# Patient Record
Sex: Male | Born: 1950 | Race: Black or African American | Hispanic: No | Marital: Married | Smoking: Never smoker
Health system: Southern US, Community
[De-identification: ages and names within clinical notes are randomized; demographics above are authoritative.]

## PROBLEM LIST (undated history)

## (undated) DIAGNOSIS — I10 Essential (primary) hypertension: Secondary | ICD-10-CM

## (undated) DIAGNOSIS — L299 Pruritus, unspecified: Secondary | ICD-10-CM

## (undated) DIAGNOSIS — R0600 Dyspnea, unspecified: Secondary | ICD-10-CM

## (undated) DIAGNOSIS — E119 Type 2 diabetes mellitus without complications: Secondary | ICD-10-CM

## (undated) DIAGNOSIS — K759 Inflammatory liver disease, unspecified: Secondary | ICD-10-CM

## (undated) DIAGNOSIS — F528 Other sexual dysfunction not due to a substance or known physiological condition: Secondary | ICD-10-CM

## (undated) DIAGNOSIS — R079 Chest pain, unspecified: Secondary | ICD-10-CM

## (undated) DIAGNOSIS — K219 Gastro-esophageal reflux disease without esophagitis: Secondary | ICD-10-CM

## (undated) DIAGNOSIS — J029 Acute pharyngitis, unspecified: Secondary | ICD-10-CM

## (undated) DIAGNOSIS — H547 Unspecified visual loss: Secondary | ICD-10-CM

## (undated) DIAGNOSIS — E785 Hyperlipidemia, unspecified: Secondary | ICD-10-CM

## (undated) HISTORY — DX: Acute pharyngitis, unspecified: J02.9

## (undated) HISTORY — DX: Chest pain, unspecified: R07.9

## (undated) HISTORY — DX: Inflammatory liver disease, unspecified: K75.9

## (undated) HISTORY — DX: Gastro-esophageal reflux disease without esophagitis: K21.9

## (undated) HISTORY — PX: HIATAL HERNIA REPAIR: SHX195

## (undated) HISTORY — PX: LIVER BIOPSY: SHX301

## (undated) HISTORY — PX: CERVICAL SPINE SURGERY: SHX589

## (undated) HISTORY — DX: Essential (primary) hypertension: I10

## (undated) HISTORY — DX: Unspecified visual loss: H54.7

## (undated) HISTORY — DX: Type 2 diabetes mellitus without complications: E11.9

## (undated) HISTORY — PX: EYE SURGERY: SHX253

## (undated) HISTORY — PX: CHOLECYSTECTOMY: SHX55

## (undated) HISTORY — DX: Other sexual dysfunction not due to a substance or known physiological condition: F52.8

## (undated) HISTORY — PX: CORNEAL TRANSPLANT: SHX108

## (undated) HISTORY — DX: Pruritus, unspecified: L29.9

---

## 2003-08-20 DIAGNOSIS — H547 Unspecified visual loss: Secondary | ICD-10-CM

## 2003-08-20 HISTORY — DX: Unspecified visual loss: H54.7

## 2005-03-08 ENCOUNTER — Ambulatory Visit: Payer: Self-pay | Admitting: Cardiology

## 2005-05-14 ENCOUNTER — Ambulatory Visit (HOSPITAL_COMMUNITY): Admission: RE | Admit: 2005-05-14 | Discharge: 2005-05-14 | Payer: Self-pay | Admitting: General Surgery

## 2006-10-16 ENCOUNTER — Ambulatory Visit: Payer: Self-pay | Admitting: Cardiology

## 2007-06-25 ENCOUNTER — Ambulatory Visit: Payer: Self-pay | Admitting: Cardiology

## 2007-07-13 ENCOUNTER — Ambulatory Visit: Payer: Self-pay | Admitting: Cardiology

## 2007-07-15 ENCOUNTER — Inpatient Hospital Stay (HOSPITAL_BASED_OUTPATIENT_CLINIC_OR_DEPARTMENT_OTHER): Admission: RE | Admit: 2007-07-15 | Discharge: 2007-07-15 | Payer: Self-pay | Admitting: Internal Medicine

## 2007-07-15 ENCOUNTER — Ambulatory Visit: Payer: Self-pay | Admitting: Internal Medicine

## 2007-08-27 ENCOUNTER — Emergency Department (HOSPITAL_COMMUNITY): Admission: EM | Admit: 2007-08-27 | Discharge: 2007-08-27 | Payer: Self-pay | Admitting: Emergency Medicine

## 2007-09-03 ENCOUNTER — Emergency Department (HOSPITAL_COMMUNITY): Admission: EM | Admit: 2007-09-03 | Discharge: 2007-09-03 | Payer: Self-pay | Admitting: Emergency Medicine

## 2007-09-07 ENCOUNTER — Emergency Department (HOSPITAL_COMMUNITY): Admission: EM | Admit: 2007-09-07 | Discharge: 2007-09-07 | Payer: Self-pay | Admitting: Emergency Medicine

## 2007-10-23 ENCOUNTER — Emergency Department (HOSPITAL_COMMUNITY): Admission: EM | Admit: 2007-10-23 | Discharge: 2007-10-23 | Payer: Self-pay | Admitting: Emergency Medicine

## 2007-11-25 ENCOUNTER — Ambulatory Visit: Payer: Self-pay | Admitting: Cardiology

## 2009-08-11 ENCOUNTER — Emergency Department (HOSPITAL_COMMUNITY): Admission: EM | Admit: 2009-08-11 | Discharge: 2009-08-12 | Payer: Self-pay | Admitting: Emergency Medicine

## 2010-10-25 DIAGNOSIS — R079 Chest pain, unspecified: Secondary | ICD-10-CM

## 2011-01-01 NOTE — Assessment & Plan Note (Signed)
[facility]                          [facility] [facility] OFFICE NOTE   JAVARRI, SEGAL                     MRN:          [id number]  DATE:07/13/2007                            DOB:          1951-02-15    CARDIOLOGIST:  Learta Codding, M.D.   PRIMARY CARE PHYSICIAN:  Lia Hopping, M.D.   REASON FOR VISIT:  Post hospitalization follow-up.   HISTORY OF PRESENT ILLNESS:  Randy Lee is a 60 year old male patient  with no previously documented coronary artery disease, but significant  cardiac risk factors, who presents to the office today for follow-up  post hospitalization.  I saw him along with Dr. Myrtis Ser at [facility] on June 26, 2007.  He had presented to the hospital with  atypical chest pain and ruled out for myocardial infarction.  We  recommended outpatient dobutamine Cardiolite to rule out the possibility  of ischemia.   The patient presented on July 10, 2007 for Cardiolite study.  An  adenosine study was performed.  His images revealed reversibility that  suggests mild ischemia in the lateral wall and the apex and his EF was  47%.   Of note in the hospital the patient was noted to have significant  bradycardia with a heart rate of 49.  We recommended decreasing his dose  of Toprol.  However, the patient had his Toprol discontinued and he is  now on Spironolactone.   In the office today he notes he is doing well.  He has not had any  further chest pain.  He denies any significant dyspnea or exertion.  He  describes NYHA class II symptoms.  He denies orthopnea, PND, or pedal  edema.  He denies any syncope.   MEDICATIONS:  The accuracy of his medication list is somewhat unknown.  At this point in time the list we have includes:  1. Glucophage 500 mg b.i.d.  2. Flomax 0.4 mg daily.  3. Enalapril 2.5 mg daily.  4. Aspirin 325 mg daily.  5. Trusopt eye drops.  6. Spironolactone 25 mg daily.  7. HCTZ, unknown dosage.  8.  Nitroglycerin p.r.n. chest pain.   ALLERGIES:  No known drug allergies.   SOCIAL HISTORY:  He is an ex-smoker.   REVIEW OF SYSTEMS:  Please see HPI.  He denies any recent fevers,  chills, cough, melena, hematochezia, hematuria, dysuria.  The rest of  the review of systems are negative.   PHYSICAL EXAMINATION:  He is a well-nourished, well-developed male.  Blood pressure is 107/65, pulse 51.  HEENT:  Normal.  NECK:  Without JVD.  LYMPH:  Without lymphadenopathy.  ENDOCRINE:  Without thyromegaly.  CARDIAC:  Normal S1 and S2.  Regular rate and rhythm without murmur.  LUNGS:  Clear to auscultation bilaterally.  ABDOMEN:  Soft and nontender with NABS, no organomegaly.  EXTREMITIES:  No edema.  NEUROLOGIC:  Alert and oriented x3.  Cranial nerves II-XII grossly  intact.   IMPRESSION:  1. Atypical chest pain.      a.     Recent abnormal Cardiolite study as outlined above with  lateral wall and apical ischemia.  2. EF 47% by recent Cardiolite study.  3. Hypertension.  4. Diabetes mellitus.  5. Gastroesophageal reflux disease.  6. Ex-smoker.  7. Remote family history of coronary artery disease.  8. Obesity.  9. 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  10.Sinus bradycardia, asymptomatic.   PLAN:  The patient presents to the office today for follow-up.  As noted  above he has had a recent abnormal stress Cardiolite study.  I have  discussed with him risks and benefits of proceeding with cardiac  catheterization and he agrees to proceed.  He will remain on the current  medications as listed above.  He will contact us and let us know his  exact list of medications.  I have given him a prescription for p.r.n.  nitroglycerin.  Dr. Andee Lineman also saw him today.  We will see him back  after his cardiac catheterization.     Tereso Newcomer, PA-C  Electronically Signed      Learta Codding, MD,FACC  Electronically Signed   SW/MedQ  DD: 07/13/2007  DT: 07/13/2007  Job #: [id number]   cc:    Lia Hopping

## 2011-01-01 NOTE — Cardiovascular Report (Signed)
NAME:  Randy Lee, Randy Lee NO.:  [id number]   MEDICAL RECORD NO.:  [id number]          PATIENT TYPE:  OIB   LOCATION:  1966                         FACILITY:  [facility]   PHYSICIAN:  Bevelyn Buckles. Bensimhon, MDDATE OF BIRTH:  1950/09/05   DATE OF PROCEDURE:  07/15/2007  DATE OF DISCHARGE:                            CARDIAC CATHETERIZATION   PRIMARY CARE PHYSICIAN:  Dr. Abner Greenspan at [place].   CARDIOLOGIST:  Dr. Learta Codding.   PATIENT IDENTIFICATION:  [name] is a 60 year old male with a history  of hypertension, diabetes, hyperlipidemia and glaucoma.  He was recently  admitted with atypical chest pain, ruled out for myocardial ischemia  with serial cardiac enzymes.  He had an outpatient dobutamine  Cardiolite, which showed an EF of 47%, question mild ischemia in the  lateral wall.  He was referred for catheterization in the outpatient  laboratory.   PROBLEM LIST:  1. Selective coronary angiography.  2. Left heart cath.  3. Left ventriculogram.   DESCRIPTION OF PROCEDURE:  The risks and indications of the  catheterization were explained.  Consent was signed and placed on the  chart.  A 4-French arterial sheath was placed in the right femoral  artery using a modified Seldinger technique.  Standard catheters,  including JL-4, JR-4 and angled pigtail were used.  All catheter  exchanges were made over wire.  No apparent complications.   Central aortic pressure 149/78 with a mean of 105.  LV pressure is 168/9  with an EDP of 16.  There is no aortic stenosis.   Left main was normal.   The LAD was a long vessel coursing through the apex.  It was somewhat  narrower in caliber.  It gave off a large first diagonal, a small second  diagonal.  There was a minor luminal irregularity in the mid-LAD.   Left circumflex gave off a small OM1, a small OM2, and a large branching  OM3.  It was angiographically normal.   Right coronary was a dominant vessel.  It gave off a PDA and a  small  posterolateral.  It was angiographically normal.   Left ventriculogram done in the RAO position showed an EF of 55% to 60%.  There were no regional wall motion abnormalities.  No significant mitral  regurgitation.   ASSESSMENT:  1. Normal coronary arteries.  2. Normal left ventricular function.   Given his catheterization, his Cardiolite was a false positive.  I  suspect his chest pain is noncardiac.  We will continue with risk factor  management.      Bevelyn Buckles. Bensimhon, MD  Electronically Signed     DRB/MEDQ  D:  07/15/2007  T:  07/15/2007  Job:  [id number]   cc:   Learta Codding, MD,FACC  Dr. Abner Greenspan

## 2011-01-04 NOTE — Op Note (Signed)
NAME:  Randy Lee, Randy Lee              ACCOUNT NO.:  [id number]   MEDICAL RECORD NO.:  [id number]          PATIENT TYPE:  AMB   LOCATION:  DAY                           FACILITY:  [facility]   PHYSICIAN:  Jerolyn Shin C. Katrinka Blazing, M.D.   DATE OF BIRTH:  09-Feb-1951   DATE OF PROCEDURE:  05/14/2005  DATE OF DISCHARGE:                                 OPERATIVE REPORT   PREOPERATIVE DIAGNOSIS:  Umbilical hernia.   POSTOPERATIVE DIAGNOSIS:  Umbilical hernia.   PROCEDURE:  Umbilical hernia repair.   SURGEON:  Dirk Dress. Katrinka Blazing, M.D.   DESCRIPTION OF PROCEDURE:  Under general anesthesia, the patient's abdomen  was prepped and draped in sterile field. Curvilinear infraumbilical incision  was made. The hernia sac was dissected from the overlying skin. The sac was  dissected from the surrounding fascia. It was invaginated. The fascia was  debrided. The fascia was then closed transversely with interrupted 0  Prolene. The overlying skin was then sutured down to the fascia with two  rows of running 3-0 Monocryl. The skin was closed with 4-0 Vicryl. The  patient tolerated the procedure well. Dressings were placed. He was awakened  from anesthesia uneventfully and transferred to a bed and taken to the  postanesthetic care unit for further monitoring.      Dirk Dress. Katrinka Blazing, M.D.  Electronically Signed     LCS/MEDQ  D:  05/14/2005  T:  05/15/2005  Job:  [id number]

## 2011-01-04 NOTE — H&P (Signed)
NAME:  Randy Lee, Randy Lee              ACCOUNT NO.:  [id number]   MEDICAL RECORD NO.:  [id number]          PATIENT TYPE:  AMB   LOCATION:  DAY                           FACILITY:  [facility]   PHYSICIAN:  Jerolyn Shin C. Katrinka Blazing, M.D.   DATE OF BIRTH:  18-Jan-1951   DATE OF ADMISSION:  DATE OF DISCHARGE:  LH                                HISTORY & PHYSICAL   HISTORY OF PRESENT ILLNESS:  The patient is a 60 year old male with a  history of umbilical hernia which has been increasing in size for many  years.  It has become more symptomatic.  Because of its marked size, he is  scheduled to have it removed.   PAST MEDICAL HISTORY:  1.  Glaucoma.  2.  Diabetes mellitus.  3.  Hypertension.   MEDICATIONS:  Dosages are unknown, but medications include Inderide,  enalapril, Glucophage, and Timoptic eye drops.   PAST SURGICAL HISTORY:  Disk surgery of the neck, level not known.   SOCIAL HISTORY:  Disabled due to glaucoma.  He is a former [employer] architect.  He  does not drink or smoke or use drugs.  He is married.   PHYSICAL EXAMINATION:  VITAL SIGNS:  Blood pressure 132/82, pulse 50,  respirations 20, weight 200 pounds.  HEENT:  Unremarkable.  NECK:  Supple.  No JVD, bruits, adenopathy, or thyromegaly.  CHEST:  Clear to auscultation.  HEART:  Regular rate and rhythm without murmur, gallop or rub.  ABDOMEN:  Obese, with moderate size umbilical hernia, nontender, reducible.  EXTREMITIES:  No cyanosis, clubbing or edema.  NEUROLOGIC:  No focal motor, sensory or cerebellar deficits.   IMPRESSION:  1.  Umbilical hernia, symptomatic.  2.  Hypertension.  3.  Diabetes mellitus.  4.  Glaucoma.   PLAN:  Umbilical hernia repair.      Dirk Dress. Katrinka Blazing, M.D.  Electronically Signed     LCS/MEDQ  D:  05/13/2005  T:  05/14/2005  Job:  [id number]   cc:   Lia Hopping  Fax: [phone]   [facility]  Fax: [phone]

## 2011-02-13 LAB — BASIC METABOLIC PANEL: Glucose: 102 mg/dL

## 2011-03-20 ENCOUNTER — Encounter (INDEPENDENT_AMBULATORY_CARE_PROVIDER_SITE_OTHER): Payer: Self-pay

## 2011-04-17 ENCOUNTER — Ambulatory Visit (INDEPENDENT_AMBULATORY_CARE_PROVIDER_SITE_OTHER): Payer: Medicare Other | Admitting: Internal Medicine

## 2011-04-17 ENCOUNTER — Encounter (INDEPENDENT_AMBULATORY_CARE_PROVIDER_SITE_OTHER): Payer: Self-pay | Admitting: Internal Medicine

## 2011-04-17 VITALS — BP 164/100 | HR 76 | Temp 98.3°F | Ht 72.0 in | Wt 181.2 lb

## 2011-04-17 DIAGNOSIS — B1920 Unspecified viral hepatitis C without hepatic coma: Secondary | ICD-10-CM
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
## 2011-05-09 LAB — CBC
HCT: 42.3
Hemoglobin: 14.4
MCHC: 34
MCV: 94.5
RBC: 4.47

## 2011-05-09 LAB — DIFFERENTIAL
Basophils Relative: 0
Eosinophils Absolute: 0.1
Eosinophils Relative: 2
Monocytes Absolute: 0.6
Monocytes Relative: 11

## 2011-05-09 LAB — BASIC METABOLIC PANEL
CO2: 29
Calcium: 9.7
Chloride: 100
Glucose, Bld: 339 — ABNORMAL HIGH
Potassium: 4.1
Sodium: 134 — ABNORMAL LOW

## 2011-07-10 DIAGNOSIS — Z9889 Other specified postprocedural states: Secondary | ICD-10-CM

## 2011-07-10 DIAGNOSIS — I10 Essential (primary) hypertension: Secondary | ICD-10-CM

## 2011-11-17 ENCOUNTER — Encounter (HOSPITAL_COMMUNITY): Payer: Self-pay | Admitting: *Deleted

## 2011-11-17 ENCOUNTER — Emergency Department (HOSPITAL_COMMUNITY)
Admission: EM | Admit: 2011-11-17 | Discharge: 2011-11-17 | Disposition: A | Discharge status: Home / Self Care | Payer: Medicare Other | Attending: Emergency Medicine | Admitting: Emergency Medicine

## 2011-11-17 DIAGNOSIS — H5710 Ocular pain, unspecified eye: Secondary | ICD-10-CM | POA: Insufficient documentation

## 2011-11-17 DIAGNOSIS — Z947 Corneal transplant status: Secondary | ICD-10-CM | POA: Insufficient documentation

## 2011-11-17 DIAGNOSIS — I10 Essential (primary) hypertension: Secondary | ICD-10-CM | POA: Insufficient documentation

## 2011-11-17 DIAGNOSIS — H5789 Other specified disorders of eye and adnexa: Secondary | ICD-10-CM

## 2011-11-17 DIAGNOSIS — E119 Type 2 diabetes mellitus without complications: Secondary | ICD-10-CM | POA: Insufficient documentation

## 2011-11-17 DIAGNOSIS — B1920 Unspecified viral hepatitis C without hepatic coma: Secondary | ICD-10-CM | POA: Insufficient documentation

## 2011-11-17 MED ORDER — FLUORESCEIN SODIUM 1 MG OP STRP
1.0000 | ORAL_STRIP | Freq: Once | OPHTHALMIC | Status: DC
  Filled 2011-11-17: qty 1

## 2011-11-17 MED ORDER — TETRACAINE HCL 0.5 % OP SOLN
2.0000 [drp] | Freq: Once | OPHTHALMIC | Status: DC
  Filled 2011-11-17: qty 2

## 2011-11-17 NOTE — Discharge Instructions (Signed)
There appears to be no contact lens in your diet at this time, and no apparent injury to your eyes from attempts to get the contact lens out.  If you have any further problems with irritation of the right eye, visual changes, or other concerning symptoms, contact your ophthalmologist in the morning for reevaluation.

## 2011-11-17 NOTE — ED Notes (Signed)
The pt has had a corneal transplant and he has been wearing a hard contact lens and he has been unable to remove and it has been stuck this e=afternoon

## 2011-11-17 NOTE — ED Notes (Signed)
Patient states he was trying to remove his hard contact today from his right eye and he is unable to remove the contact and his eye is hurting and burning. Patient states he is blind in his left eye and only has sight in the right eye. Family with patient.

## 2011-11-17 NOTE — ED Provider Notes (Signed)
History     CSN: [id number]  Arrival date & time 11/17/11  1810   First MD Initiated Contact with Patient 11/17/11 1828      Chief Complaint  Patient presents with  . 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 He reports that this morning he put in a hard contact into his right thigh, and forgot about it and fell asleep for a nap x1-2 hours this afternoon, awoke realizing that he is not supposed to sleep in his contact lenses, attempted to remove the contact lens, and was unable to. He then presented to the emergency department for evaluation and for removal of contact lens. On initial evaluation, the patient has no apparent contact lens in the right eye. No apparent injury to the right eye.  Patient is a 61 y.o. male presenting with eye problem. The history is provided by the patient and a relative.  Eye Problem  This is a new problem. The current episode started 6 to 12 hours ago. The problem occurs constantly. The problem has not changed since onset.There is pain in the right eye. The injury mechanism was contact lenses. The pain is mild. There is no history of trauma to the eye. There is no known exposure to pink eye. He wears contacts. Associated symptoms include foreign body sensation. Pertinent negatives include no numbness, no blurred vision, no decreased vision, no discharge, no photophobia, no eye redness, no nausea, no vomiting and no itching.    Past Medical History  Diagnosis Date  . Chest pain, unspecified   . Unspecified essential hypertension   . Esophageal reflux   . Acute pharyngitis   . Unspecified pruritic disorder   . Psychosexual dysfunction with inhibited sexual excitement   . Blind 2005  . Type II or unspecified type  diabetes mellitus without mention of complication, not stated as uncontrolled   . Hepatitis hep c    Diagnosed last year  . Diabetes mellitus type 2, uncomplicated     x 7 years    Past Surgical History  Procedure Date  . Eye surgery     cataracts  . Corneal transplant   . Corneal transplant     rejected  . Cervical spine surgery   . Hiatal hernia repair     No family history on file.  History  Substance Use Topics  . Smoking status: Never Smoker   . Smokeless tobacco: Not on file  . Alcohol Use: No     Quit 30 yrs ago      Review of Systems  Eyes: Negative for blurred vision, photophobia, pain, discharge, redness, itching and visual disturbance.  Gastrointestinal: Negative for nausea and vomiting.  Skin: Negative for itching.  Neurological: Negative for numbness.    Allergies  Review of patient's allergies indicates no known allergies.  Home Medications   Current Outpatient Rx  Name Route Sig Dispense Refill  . AMLODIPINE BESYLATE 5 MG PO TABS Oral Take 5 mg by mouth daily.      . ASPIRIN 81 MG PO TABS Oral Take 81 mg by mouth daily.      Marland Kitchen ESOMEPRAZOLE MAGNESIUM 40 MG PO CPDR Oral Take 40 mg by mouth daily before breakfast.      . FINASTERIDE 5 MG PO TABS Oral Take 5 mg by mouth  daily.      . INSULIN ISOPHANE & REGULAR (70-30) 100 UNIT/ML [place] SUSP Subcutaneous Inject 15-25 Units into the skin 2 (two) times daily with a meal. 25 units in a.m. 15 units in p.m.    . LISINOPRIL-HYDROCHLOROTHIAZIDE 20-12.5 MG PO TABS Oral Take 1 tablet by mouth daily.      Marland Kitchen METFORMIN HCL 500 MG PO TABS Oral Take 1,000 mg by mouth 2 (two) times daily with a meal.     . SILDENAFIL CITRATE 100 MG PO TABS Oral Take 100 mg by mouth daily as needed. For e.d.    . TAMSULOSIN HCL 0.4 MG PO CAPS Oral Take 0.4 mg by mouth daily.       BP 141/77  Pulse 64  Temp(Src) 98.1 F (36.7 C) (Oral)  Resp 20  SpO2 96%  Physical Exam  Constitutional: He is oriented to person, place, and time. He  appears well-nourished. No distress.  HENT:  Head: Normocephalic and atraumatic.  Nose: Nose normal.  Mouth/Throat: Oropharynx is clear and moist.  Eyes: Conjunctivae, EOM and lids are normal. Pupils are equal, round, and reactive to light. No foreign bodies found. Right eye exhibits no chemosis, no discharge, no exudate and no hordeolum. No foreign body present in the right eye. Left eye exhibits no chemosis, no discharge, no exudate and no hordeolum. No foreign body present in the left eye. No scleral icterus.  Slit lamp exam:      The right eye shows no corneal abrasion, no corneal flare, no corneal ulcer, no foreign body, no hyphema, no hypopyon, no fluorescein uptake and no anterior chamber bulge.       Evident postoperative surgical changes to the right eye, with no evident corneal abrasion, no contact lens a foreign body on the eye, no contact lens under the eyelid.  Neck: Normal range of motion. Neck supple.  Cardiovascular: Normal rate and regular rhythm.   Pulmonary/Chest: Effort normal and breath sounds normal. No respiratory distress.  Musculoskeletal: Normal range of motion. He exhibits no edema and no tenderness.  Lymphadenopathy:    He has no cervical adenopathy.  Neurological: He is alert and oriented to person, place, and time. No cranial nerve deficit. Coordination normal.  Skin: Skin is warm and dry. No rash noted. He is not diaphoretic. No erythema. No pallor.  Psychiatric: He has a normal mood and affect. His behavior is normal. Judgment and thought content normal.    ED Course  Procedures (including critical care time)  Labs Reviewed - No data to display No results found.   No diagnosis found.    MDM  The contact lens has apparently come out of his right eye, the patient states that he no longer feels it to be present, and no contact lenses seen on examination whether with the naked eye or with the ophthalmoscope, and fluorescein uptake is negative showing no  sign of corneal abrasion from his attempts to get the contact lens out. I informed the patient contact lenses out, his eye appears healthy, and to follow up with his ophthalmologist this week, calling tomorrow morning if he is having any persistent symptoms. The patient states his understanding of and agreement with the plan of care.       Felisa Bonier, MD 11/17/11 Barry Brunner

## 2011-12-06 ENCOUNTER — Encounter (INDEPENDENT_AMBULATORY_CARE_PROVIDER_SITE_OTHER): Payer: Self-pay

## 2012-01-20 ENCOUNTER — Emergency Department (HOSPITAL_COMMUNITY)
Admission: EM | Admit: 2012-01-20 | Discharge: 2012-01-20 | Disposition: A | Discharge status: Home / Self Care | Payer: Medicare Other | Attending: Emergency Medicine | Admitting: Emergency Medicine

## 2012-01-20 ENCOUNTER — Encounter (HOSPITAL_COMMUNITY): Payer: Self-pay | Admitting: *Deleted

## 2012-01-20 ENCOUNTER — Encounter (HOSPITAL_COMMUNITY): Payer: Self-pay | Admitting: Pharmacy Technician

## 2012-01-20 ENCOUNTER — Emergency Department (HOSPITAL_COMMUNITY): Payer: Medicare Other

## 2012-01-20 DIAGNOSIS — Z9089 Acquired absence of other organs: Secondary | ICD-10-CM | POA: Insufficient documentation

## 2012-01-20 DIAGNOSIS — Z794 Long term (current) use of insulin: Secondary | ICD-10-CM | POA: Insufficient documentation

## 2012-01-20 DIAGNOSIS — I10 Essential (primary) hypertension: Secondary | ICD-10-CM | POA: Insufficient documentation

## 2012-01-20 DIAGNOSIS — R110 Nausea: Secondary | ICD-10-CM | POA: Insufficient documentation

## 2012-01-20 DIAGNOSIS — R1011 Right upper quadrant pain: Secondary | ICD-10-CM | POA: Insufficient documentation

## 2012-01-20 DIAGNOSIS — Z8619 Personal history of other infectious and parasitic diseases: Secondary | ICD-10-CM | POA: Insufficient documentation

## 2012-01-20 DIAGNOSIS — E119 Type 2 diabetes mellitus without complications: Secondary | ICD-10-CM | POA: Insufficient documentation

## 2012-01-20 LAB — DIFFERENTIAL
Basophils Relative: 0 % (ref 0–1)
Eosinophils Absolute: 0.2 10*3/uL (ref 0.0–0.7)
Lymphs Abs: 3.3 10*3/uL (ref 0.7–4.0)
Monocytes Absolute: 0.5 10*3/uL (ref 0.1–1.0)
Monocytes Relative: 8 % (ref 3–12)

## 2012-01-20 LAB — CBC
HCT: 37.1 % — ABNORMAL LOW (ref 39.0–52.0)
Hemoglobin: 13.4 g/dL (ref 13.0–17.0)
MCH: 31.9 pg (ref 26.0–34.0)
MCHC: 36.1 g/dL — ABNORMAL HIGH (ref 30.0–36.0)
RBC: 4.2 MIL/uL — ABNORMAL LOW (ref 4.22–5.81)

## 2012-01-20 LAB — URINALYSIS, ROUTINE W REFLEX MICROSCOPIC
Bilirubin Urine: NEGATIVE
Ketones, ur: NEGATIVE mg/dL
Leukocytes, UA: NEGATIVE
Nitrite: NEGATIVE
Urobilinogen, UA: 0.2 mg/dL (ref 0.0–1.0)
pH: 6 (ref 5.0–8.0)

## 2012-01-20 LAB — COMPREHENSIVE METABOLIC PANEL
Albumin: 3.8 g/dL (ref 3.5–5.2)
Alkaline Phosphatase: 49 U/L (ref 39–117)
BUN: 14 mg/dL (ref 6–23)
Chloride: 99 mEq/L (ref 96–112)
Creatinine, Ser: 0.69 mg/dL (ref 0.50–1.35)
GFR calc Af Amer: >90 mL/min (ref >90)
Glucose, Bld: 127 mg/dL — ABNORMAL HIGH (ref 70–99)
Total Bilirubin: 0.4 mg/dL (ref 0.3–1.2)
Total Protein: 8.5 g/dL — ABNORMAL HIGH (ref 6.0–8.3)

## 2012-01-20 LAB — LIPASE, BLOOD: Lipase: 16 U/L (ref 11–59)

## 2012-01-20 MED ORDER — ONDANSETRON HCL 4 MG PO TABS: 4.0000 mg | ORAL_TABLET | Freq: Four times a day (QID) | ORAL | Status: AC

## 2012-01-20 MED ORDER — OXYCODONE-ACETAMINOPHEN 5-325 MG PO TABS: 2.0000 | ORAL_TABLET | ORAL | Status: DC | PRN

## 2012-01-20 MED ORDER — HYDROMORPHONE HCL PF 1 MG/ML IJ SOLN
1.0000 mg | Freq: Once | INTRAMUSCULAR | Status: AC
  Administered 2012-01-20: 1 mg via INTRAVENOUS
  Filled 2012-01-20: qty 1

## 2012-01-20 MED ORDER — ONDANSETRON HCL 4 MG/2ML IJ SOLN
4.0000 mg | Freq: Once | INTRAMUSCULAR | Status: AC
  Administered 2012-01-20: 4 mg via INTRAVENOUS
  Filled 2012-01-20: qty 2

## 2012-01-20 MED ORDER — SODIUM CHLORIDE 0.9 % IV BOLUS (SEPSIS)
1000.0000 mL | Freq: Once | INTRAVENOUS | Status: AC
  Administered 2012-01-20: 1000 mL via INTRAVENOUS

## 2012-01-20 MED ORDER — IOHEXOL 300 MG/ML  SOLN
100.0000 mL | Freq: Once | INTRAMUSCULAR | Status: AC | PRN
  Administered 2012-01-20: 100 mL via INTRAVENOUS

## 2012-01-20 NOTE — ED Notes (Signed)
RUQ pain x 1 month and worsening.  Nausea, no vomiting.  No diarrhea. Cholecystectomy in Nov 2012

## 2012-01-20 NOTE — ED Notes (Signed)
Taken to ct  

## 2012-01-20 NOTE — ED Notes (Signed)
edp aware pt requesting pain med.  

## 2012-01-20 NOTE — ED Provider Notes (Signed)
History   This chart was scribed for Glynn Octave, MD by Clarita Crane. The patient was seen in room APA09/APA09. Patient's care was started at 1139.    CSN: [id number]  Arrival date & time 01/20/12  1139   First MD Initiated Contact with Patient 01/20/12 1259      Chief Complaint  Patient presents with  . Abdominal Pain    (Consider location/radiation/quality/duration/timing/severity/associated sxs/prior treatment) HPI Randy Lee is a 61 y.o. male who presents to the Emergency Department complaining of waxing and waning moderate RUQ abdominal pain described as stinging and sharp onset 1 month ago but worse the past 10 days with associated nausea, back pain, dysuria and a mild episode of chest pain this morning which is currently resolved. Patient notes pain is aggravated with walking, bending and movement. States pain is relieved by nothing. Denies fever, chills, vomiting, SOB. Patient notes he was evaluated by Dr. Lovell Sheehan 1 week ago for similar pain and was recommended to have surgery performed to remove scar tissue from region. Patient with h/o cholecystectomy performed in November 2012 at [facility] by Dr. Ashok Cordia, hepatitis C, diabetes, blindness.    Past Medical History  Diagnosis Date  . Chest pain, unspecified   . Unspecified essential hypertension   . Esophageal reflux   . Acute pharyngitis   . Unspecified pruritic disorder   . Psychosexual dysfunction with inhibited sexual excitement   . Blind 2005  . Type II or unspecified type diabetes mellitus without mention of complication, not stated as uncontrolled   . Hepatitis hep c    Diagnosed last year  . Diabetes mellitus type 2, uncomplicated     x 7 years    Past Surgical History  Procedure Date  . Eye surgery     cataracts  . Corneal transplant   . Corneal transplant     rejected  . Cervical spine surgery   . Hiatal hernia repair   . Cholecystectomy   . Liver biopsy     History reviewed. No  pertinent family history.  History  Substance Use Topics  . Smoking status: Never Smoker   . Smokeless tobacco: Not on file  . Alcohol Use: No     Quit 30 yrs ago      Review of Systems A complete 10 system review of systems was obtained and all systems are negative except as noted in the HPI and PMH.   Allergies  Review of patient's allergies indicates no known allergies.  Home Medications   Current Outpatient Rx  Name Route Sig Dispense Refill  . AMLODIPINE BESYLATE 5 MG PO TABS Oral Take 5 mg by mouth daily.     . ASPIRIN EC 81 MG PO TBEC Oral Take 81 mg by mouth daily.    Marland Kitchen DIPHENHYDRAMINE HCL 25 MG PO TABS Oral Take 50 mg by mouth every 6 (six) hours as needed. For itching    . ESOMEPRAZOLE MAGNESIUM 40 MG PO CPDR Oral Take 40 mg by mouth daily before breakfast.      . FINASTERIDE 5 MG PO TABS Oral Take 5 mg by mouth daily.      Marland Kitchen HYDROCODONE-ACETAMINOPHEN 5-500 MG PO TABS Oral Take 1 tablet by mouth every 4 (four) hours as needed. For pain    . INSULIN ISOPHANE & REGULAR (70-30) 100 UNIT/ML [place] SUSP Subcutaneous Inject 15-25 Units into the skin 2 (two) times daily with a meal. 25 units in morning and 15 units in the evening    .  LISINOPRIL-HYDROCHLOROTHIAZIDE 20-25 MG PO TABS Oral Take 1 tablet by mouth 2 (two) times daily.     Marland Kitchen METFORMIN HCL 1000 MG PO TABS Oral Take 1,000 mg by mouth 2 (two) times daily with a meal.    . PREDNISOLONE ACETATE 1 % OP SUSP Right Eye Place 1 drop into the right eye 3 (three) times daily.    Marland Kitchen TAMSULOSIN HCL 0.4 MG PO CAPS Oral Take 0.4 mg by mouth daily.     Marland Kitchen TIMOLOL MALEATE 0.5 % OP SOLN Both Eyes Place 1 drop into both eyes 2 (two) times daily.    Marland Kitchen SILDENAFIL CITRATE 100 MG PO TABS Oral Take 100 mg by mouth daily as needed. For e.d.      BP 159/92  Pulse 58  Temp(Src) 97.8 F (36.6 C) (Oral)  Resp 14  Ht 5' 9.5" (1.765 m)  Wt 175 lb (79.379 kg)  BMI 25.47 kg/m2  SpO2 98%  Physical Exam  Nursing note and vitals  reviewed. Constitutional: He is oriented to person, place, and time. He appears well-developed and well-nourished. No distress.  HENT:  Head: Normocephalic and atraumatic.  Eyes: EOM are normal. Pupils are equal, round, and reactive to light.  Neck: Neck supple. No tracheal deviation present.  Cardiovascular: Normal rate and regular rhythm.  Exam reveals no gallop and no friction rub.   No murmur heard. Pulmonary/Chest: Effort normal. No respiratory distress. He has no wheezes. He has no rales.  Abdominal: Soft. He exhibits no distension. There is tenderness. There is guarding.       Well healed RUQ scar. Positive murphy's sig. Palpable liver.  Musculoskeletal: Normal range of motion. He exhibits no edema.       Paralumbar spinal tenderness.   Neurological: He is alert and oriented to person, place, and time. No sensory deficit.  Skin: Skin is warm and dry.  Psychiatric: He has a normal mood and affect. His behavior is normal.    ED Course  Procedures (including critical care time)  DIAGNOSTIC STUDIES: Oxygen Saturation is 100% on room air, normal by my interpretation.    COORDINATION OF CARE: 1:15PM-Patient informed of current plan for treatment and evaluation and agrees with plan at this time.     Labs Reviewed  CBC - Abnormal; Notable for the following:    RBC 4.20 (*)    HCT 37.1 (*)    MCHC 36.1 (*)    All other components within normal limits  DIFFERENTIAL - Abnormal; Notable for the following:    Neutrophils Relative 35 (*)    Lymphocytes Relative 54 (*)    All other components within normal limits  COMPREHENSIVE METABOLIC PANEL - Abnormal; Notable for the following:    Potassium 3.0 (*)    Glucose, Bld 127 (*)    Total Protein 8.5 (*)    AST 45 (*)    ALT 67 (*)    All other components within normal limits  LIPASE, BLOOD  URINALYSIS, ROUTINE W REFLEX MICROSCOPIC  LACTIC ACID, PLASMA   Ct Abdomen Pelvis W Contrast  01/20/2012  *RADIOLOGY REPORT*  Clinical  Data: Right upper quadrant abdominal pain x1 month, nausea, hepatitis C, status post cholecystectomy  CT ABDOMEN AND PELVIS WITH CONTRAST  Technique:  Multidetector CT imaging of the abdomen and pelvis was performed following the standard protocol during bolus administration of intravenous contrast.  Contrast: OMNIPAQUE IOHEXOL 300 MG/ML  SOLN  Comparison: [place] CT abdomen pelvis dated 08/02/2011  Findings: Mild subpleural reticulation/dependent atelectasis at  the lung bases.  Liver, spleen, pancreas, and adrenal glands are within normal limits.  Status post cholecystectomy.  No intrahepatic or extrahepatic ductal dilatation.  Again seen is high density within the gallbladder fossa (series 2/image 26), possibly reflecting postsurgical changes, with interval improvement of associated fluid/gas seen on the prior study.  Tiny right upper pole cyst (series 2/image 31).  Left kidney is within normal limits.  No hydronephrosis.  No evidence of bowel obstruction.  Normal appendix.  Atherosclerotic calcifications of the abdominal aorta and branch vessels.  No abdominopelvic ascites.  11 mm gastrohepatic node (series 2/image 26).  Additional smaller upper abdominal/retroperitoneal lymph nodes which do not meet pathologic CT size criteria.  Enlargement of the central gland of the prostate which indents the base of the bladder (series 2/image 82).  Bladder is within normal limits.  Mild degenerative changes at L5-S1.  IMPRESSION: Status post cholecystectomy.  Prior fluid/gas in the gallbladder fossa has resolved. Suspected postsurgical changes, as above.  Normal appendix.  No evidence of bowel obstruction.  No CT findings to account for the patient's abdominal pain.  Original Report Authenticated By: Charline Bills, M.D.   US Abdomen Limited Ruq  01/20/2012  *RADIOLOGY REPORT*  Clinical Data:  Right upper quadrant pain.  LIMITED ABDOMINAL ULTRASOUND - RIGHT UPPER QUADRANT  Comparison:  None.  Findings:   Gallbladder:  Prior cholecystectomy.  Within the gallbladder fossa, there is a complex fluid collection measuring 2.7 x 1.9 x 1.1 cm. This could represent a postoperative fluid collection, less likely bowel.  Common bile duct:  Normal caliber, 4 mm.  Liver:  Normal size and echotexture.  No focal abnormality.  IMPRESSION: Small fluid collection within the gallbladder fossa following cholecystectomy.  Cannot exclude postoperative fluid collection such as liquefied hematoma or abscess.  Recommend clinical correlation.                   Original Report Authenticated By: Cyndie Chime, M.D.     No diagnosis found.    MDM  1 month of RUQ pain that has been intermittent but constant and severe in the past 3 days.  S/p choleycystectomy at [facility] Nov 2012.  Saw Dr. Lovell Sheehan last week and was told his pain may be coming from "scar tissue".  Nausea, no vomiting.   Results discussed with Dr. Lovell Sheehan. He states patient is scheduled for revision of the scar tissue this Friday.  Small fluid collection may represent postoperative changes. No emergent surgery indicated. Followup on Friday as scheduled. We'll treat pain.   I personally performed the services described in this documentation, which was scribed in my presence.  The recorded information has been reviewed and considered.    Glynn Octave, MD 01/21/12 [phone]

## 2012-01-20 NOTE — Discharge Instructions (Signed)
Abdominal Pain followup with Dr. Lovell Sheehan on Friday for her surgery as scheduled. Return to the ED if you develop new or worsening symptoms. Abdominal pain can be caused by many things. Your caregiver decides the seriousness of your pain by an examination and possibly blood tests and X-rays. Many cases can be observed and treated at home. Most abdominal pain is not caused by a disease and will probably improve without treatment. However, in many cases, more time must pass before a clear cause of the pain can be found. Before that point, it may not be known if you need more testing, or if hospitalization or surgery is needed. HOME CARE INSTRUCTIONS   Do not take laxatives unless directed by your caregiver.   Take pain medicine only as directed by your caregiver.   Only take over-the-counter or prescription medicines for pain, discomfort, or fever as directed by your caregiver.   Try a clear liquid diet (broth, tea, or water) for as long as directed by your caregiver. Slowly move to a bland diet as tolerated.  SEEK IMMEDIATE MEDICAL CARE IF:   The pain does not go away.   You have a fever.   You keep throwing up (vomiting).   The pain is felt only in portions of the abdomen. Pain in the right side could possibly be appendicitis. In an adult, pain in the left lower portion of the abdomen could be colitis or diverticulitis.   You pass bloody or black tarry stools.  MAKE SURE YOU:   Understand these instructions.   Will watch your condition.   Will get help right away if you are not doing well or get worse.  Document Released: 05/15/2005 Document Revised: 07/25/2011 Document Reviewed: 03/23/2008 [facility] Patient Information 2012 [place], [place].

## 2012-01-20 NOTE — ED Notes (Signed)
Pt back to floor from x-ray

## 2012-01-22 ENCOUNTER — Encounter (HOSPITAL_COMMUNITY): Payer: Self-pay

## 2012-01-22 ENCOUNTER — Ambulatory Visit (HOSPITAL_COMMUNITY): Payer: Medicare Other

## 2012-01-22 ENCOUNTER — Encounter (HOSPITAL_COMMUNITY)
Admit: 2012-01-22 | Discharge: 2012-01-22 | Disposition: A | Discharge status: Home / Self Care | Payer: Medicare Other | Attending: General Surgery | Admitting: General Surgery

## 2012-01-22 ENCOUNTER — Other Ambulatory Visit: Payer: Self-pay

## 2012-01-22 ENCOUNTER — Ambulatory Visit (HOSPITAL_COMMUNITY): Admission: RE | Admit: 2012-01-22 | Payer: Medicare Other | Source: Ambulatory Visit

## 2012-01-22 NOTE — Patient Instructions (Signed)
20 Randy Lee  01/22/2012   Your procedure is scheduled on:  Friday, 01/24/12  Report to [facility] at 0730 AM.  Call this number if you have problems the morning of surgery: [phone]   Remember:   Do not eat food:After Midnight.  May have clear liquids:until Midnight .  Clear liquids include soda, tea, black coffee, apple or grape juice, broth.  Take these medicines the morning of surgery with A SIP OF WATER: nexium, amlodipine, zestoretic   Do not wear jewelry, make-up or nail polish.  Do not wear lotions, powders, or perfumes. You may wear deodorant.  Do not shave 48 hours prior to surgery. Men may shave face and neck.  Do not bring valuables to the hospital.  Contacts, dentures or bridgework may not be worn into surgery.  Leave suitcase in the car. After surgery it may be brought to your room.  For patients admitted to the hospital, checkout time is 11:00 AM the day of discharge.   Patients discharged the day of surgery will not be allowed to drive home.  Name and phone number of your driver: family  Special Instructions: CHG Shower Use Special Wash: 1/2 bottle night before surgery and 1/2 bottle morning of surgery.   Please read over the following fact sheets that you were given: Pain Booklet, MRSA Information, Surgical Site Infection Prevention, Anesthesia Post-op Instructions and Care and Recovery After Surgery   Wound Debridement A wound must be clean to heal. It also must get a good supply of blood. Anything that is stopping this must be taken out of the wound. This could be dead tissue, scar tissue, or a collection of fluid (pus). Something from outside the body also might have gotten into the wound. These items are removed in a procedure called debridement. Wounds that are not cleaned by debridement heal slowly or not at all. They can become infected. The infection can also spread to nearby areas or to other parts of the body through the blood. Debridement is sometimes done  to get a sample of tissue from the wound. The tissue can be checked under a microscope or sent to a lab for testing. LET YOUR CAREGIVER KNOW ABOUT:   Allergies to food or medicine.   Medicines taken, including vitamins, herbs, eyedrops, over-the-counter medicines, and creams.   Use of steroids (by mouth or creams).   Previous problems with anesthetics or numbing medicines.   History of bleeding problems or blood clots.   Previous surgery.   Other health problems, including diabetes and kidney problems.   Possibility of pregnancy, if this applies.  RISKS AND COMPLICATIONS  Problems from debridement are rare. If they do occur, they are usually not serious. Risks could include:  Bleeding that does not stop.   Infection.   Damage to healthy tissue inside the wound. This could include blood vessels or nerves.   Pain.   Lack of healing. This could mean another debridement is needed.  BEFORE THE PROCEDURE   The wound will be checked for signs of healing or infection. This may include:   Blood tests to check for infection.   Measuring the wound. This includes how deep it is. A metal tool (probe) may be used.   You must give informed consent. This requires signing a legal paper that gives permission for the procedure.   You may need to speak with the person who would give you anesthesia during the procedure (anesthesiologist). Sometimes anesthesia is not needed. If anesthesia is  used, the area around the wound may be numbed (local anesthesia). If the wound is deep or wide, you may be put to sleep during the surgery (general anesthesia). Ask your caregiver what to expect.   If you are having general anesthesia, do not eat or drink anything for at least 6 hours before the procedure. Ask if it is okay to take needed medicine with a sip of water.   This might be an outpatient procedure, meaning you will go home the same day. Make arrangements ahead of time for someone to drive you  home. Also, make sure someone can stay with you for a few days.   On the day of the debridement, your caregiver will need to know the last time you had anything to eat or drink. This includes water, gum, and candy.  PROCEDURE   Small monitors may be placed on your body. They are used to check your heart, blood pressure, and oxygen level.   You may be given medicine through an intravenous (IV) access tube.   You might be given a medicine to help you relax (sedative).   You may be given general anesthesia to help you sleep or local anesthesia to numb the area around the wound.  Surgical debridement:  Once you are asleep or numb, the wound may be washed with a sterile saltwater solution.   Scissors, surgical knives (scalpels), and surgical tweezers (forceps) will be used to remove dead or dying tissue. Any other material that should not be in the wound will also be taken out.   After the wound is clean, it will be washed again.   A bandage (dressing) may be placed over the wound.  Sometimes, other methods are used. These methods might be done along with surgical debridement or instead of it. They include: Mechanical debridement:  A moist dressing is placed over the wound. It is left in place until it is dry. The dressing is lifted off. This lifts away any dead tissue.   Sometimes, whirlpool baths are used.   Sometimes, pulsed lavage is done. This involves flushing the wound with sterile solution under pressure.  Chemical debridement:  A chemical medicine is put on the wound. The aim is to dissolve dead or dying tissue. Ointments may also be used.  Autolytic debridement:  A special dressing is used to trap moisture inside the wound. The goal is for the wound to heal naturally under the dressing. Healing takes longer with this treatment.  AFTER THE PROCEDURE   You will stay in a recovery area until the anesthesia has worn off. Your blood pressure and pulse will be checked  periodically. You may continue to get fluids through the IV for awhile. Once you have recovered, you should be able to go home.   Some pain after debridement is normal. You will probably be given pain medicine.   Before you go home, make sure you know how to care for the wound. This includes knowing when the dressing should be changed and how to change it.   Set a follow-up appointment before leaving.  HOME CARE INSTRUCTIONS   Take all medicine that has been prescribed. Follow the directions carefully. Do not take over-the-counter painkillers unless your caregiver says it is okay. Some of them can cause bleeding after surgical procedures.   Do not drive if you are taking narcotic pain medicine.   Follow all instructions about caring for your wound.   How active you can be will depend on your  wound. Ask your caregiver whether there is anything you should or should not do while you heal.   Keep all follow-up appointments.  SEEK MEDICAL CARE IF:   You have any questions about medicines.   You have any questions about caring for your wound.   Pain continues, even after taking pain medicine.   You have an oral temperature above 102 F (38.9 C).  SEEK IMMEDIATE MEDICAL CARE IF:   Pain increases, even after taking pain medicine.   A bad smell comes from the wound.   The wound becomes red or swollen.   Blood or other fluid is leaking from the wound.   The skin around the wound becomes white, blue, or black.   You have an oral temperature above 102 F (38.9 C), not controlled by medicine.  MAKE SURE YOU:   Understand these instructions.   Will watch your condition.   Will get help right away if you are not doing well or get worse.  Document Released: 10/30/2009 Document Revised: 07/25/2011 Document Reviewed: 10/30/2009 [facility] Patient Information 2012 [place], [place].

## 2012-01-22 NOTE — H&P (Signed)
NTS SOAP Note  Vital Signs:  Vitals as of: 01/07/2012: Systolic 162: Diastolic 100: Heart Rate 58: Temp 96.58F: Height 64ft 9.5in: Weight 184Lbs 0 Ounces: Pain Level 7: BMI 27  BMI : 26.78 kg/m2  Subjective: This 13 Years 66 Months old Male presents for of    ABDOMINAL PAIN : ,Has point tenderness along the right upper quadrant incision from open cholecystectomy done 11/12.  States the pain has been worsening over the past few months.  Made worse with movement.  No lump noted.  Not associated with food.  CT scan done three weeks ago at [facility] was reportedly unremarkable.  Review of Symptoms:  Constitutional:unremarkable   Head:unremarkable    :  legally blind Nose/Mouth/Throat:unremarkable Cardiovascular:    chest pressure Respiratory:unremarkable   Gastrointestin    abdominal pain,heartburn Genitourinary:    frequency   neck, joint pain Skin:unremarkable Hematolgic/Lymphatic:unremarkable     Allergic/Immunologic:unremarkable     Past Medical History:    Reviewed   Past Medical History  Surgical History: neck, open cholecystectomy, herniorrhaphy, eye, liver biopsy Medical Problems: partially blind, hep c, IDDM, HTN Allergies: nkda Medications: metformin, lisinopril/hctz, novolog, nexium, hydrocodone, baby asa, amlodipine, neurotin, flomax, tramadol   Social History:Reviewed  Social History  Preferred Language: English ([place]) Ethnicity: Not Hispanic / Latino Age: 61 Years 3 Months Alcohol:  No Recreational drug(s):  No   Smoking Status: Never smoker reviewed on 01/07/2012  Family History:  Reviewed   Family History  Is there a family history [id number]    Objective Information: General:  Well appearing, well nourished in no distress. Head:Atraumatic; no masses; no abnormalities Heart:  RRR, no murmur Lungs:    CTA bilaterally, no wheezes, rhonchi, rales.  Breathing  unlabored. Abdomen:Soft, tender along right upper quadrant incision, lumpy in nature, no hernia appreciated, ND, no HSM, no masses.  Assessment:Incisional pain, ?cicatrix formation  Diagnosis &amp; Procedure: DiagnosisCode: 789.01, ProcedureCode: 19147,    Plan:  Scheduled for debridement and closure of abdominal wound on 01/24/12.  Risks and benefits of procedure including nonresolution of abdominal pain were fully explained to the patient, who gives informed consent.

## 2012-01-24 ENCOUNTER — Ambulatory Visit (HOSPITAL_COMMUNITY): Payer: Medicare Other | Admitting: Anesthesiology

## 2012-01-24 ENCOUNTER — Encounter (HOSPITAL_COMMUNITY): Payer: Self-pay | Admitting: Anesthesiology

## 2012-01-24 ENCOUNTER — Encounter (HOSPITAL_COMMUNITY)
Admission: RE | Disposition: A | Discharge status: Home / Self Care | Payer: Self-pay | Source: Ambulatory Visit | Attending: General Surgery

## 2012-01-24 ENCOUNTER — Encounter (HOSPITAL_COMMUNITY): Payer: Self-pay | Admitting: *Deleted

## 2012-01-24 ENCOUNTER — Ambulatory Visit (HOSPITAL_COMMUNITY)
Admission: RE | Admit: 2012-01-24 | Discharge: 2012-01-24 | Disposition: A | Discharge status: Home / Self Care | Payer: Medicare Other | Source: Ambulatory Visit | Attending: General Surgery | Admitting: General Surgery

## 2012-01-24 DIAGNOSIS — R109 Unspecified abdominal pain: Secondary | ICD-10-CM | POA: Insufficient documentation

## 2012-01-24 DIAGNOSIS — L905 Scar conditions and fibrosis of skin: Secondary | ICD-10-CM | POA: Insufficient documentation

## 2012-01-24 DIAGNOSIS — E119 Type 2 diabetes mellitus without complications: Secondary | ICD-10-CM | POA: Insufficient documentation

## 2012-01-24 DIAGNOSIS — I10 Essential (primary) hypertension: Secondary | ICD-10-CM | POA: Insufficient documentation

## 2012-01-24 DIAGNOSIS — Z01810 Encounter for preprocedural cardiovascular examination: Secondary | ICD-10-CM | POA: Insufficient documentation

## 2012-01-24 DIAGNOSIS — Z01812 Encounter for preprocedural laboratory examination: Secondary | ICD-10-CM | POA: Insufficient documentation

## 2012-01-24 DIAGNOSIS — Z79899 Other long term (current) drug therapy: Secondary | ICD-10-CM | POA: Insufficient documentation

## 2012-01-24 HISTORY — PX: WOUND DEBRIDEMENT: SHX247

## 2012-01-24 LAB — POCT I-STAT, CHEM 8
Calcium, Ion: 1.28 mmol/L (ref 1.12–1.32)
Creatinine, Ser: 0.8 mg/dL (ref 0.50–1.35)
Glucose, Bld: 168 mg/dL — ABNORMAL HIGH (ref 70–99)
Hemoglobin: 14.6 g/dL (ref 13.0–17.0)
Sodium: 142 mEq/L (ref 135–145)
TCO2: 29 mmol/L (ref 0–100)

## 2012-01-24 SURGERY — DEBRIDEMENT, WOUND, ABDOMEN
Anesthesia: General | Wound class: Clean

## 2012-01-24 MED ORDER — GLYCOPYRROLATE 0.2 MG/ML IJ SOLN
INTRAMUSCULAR | Status: DC | PRN
  Administered 2012-01-24: .4 mg via INTRAVENOUS
  Administered 2012-01-24: 0.2 mg via INTRAVENOUS

## 2012-01-24 MED ORDER — PROPOFOL 10 MG/ML IV BOLUS
INTRAVENOUS | Status: DC | PRN
  Administered 2012-01-24: 150 mg via INTRAVENOUS

## 2012-01-24 MED ORDER — KETOROLAC TROMETHAMINE 30 MG/ML IJ SOLN
30.0000 mg | Freq: Once | INTRAMUSCULAR | Status: AC
  Administered 2012-01-24: 30 mg via INTRAVENOUS

## 2012-01-24 MED ORDER — FENTANYL CITRATE 0.05 MG/ML IJ SOLN
25.0000 ug | INTRAMUSCULAR | Status: DC | PRN
  Administered 2012-01-24 (×2): 50 ug via INTRAVENOUS

## 2012-01-24 MED ORDER — BUPIVACAINE HCL (PF) 0.5 % IJ SOLN
INTRAMUSCULAR | Status: DC | PRN
  Administered 2012-01-24: 10 mL

## 2012-01-24 MED ORDER — MIDAZOLAM HCL 2 MG/2ML IJ SOLN
1.0000 mg | INTRAMUSCULAR | Status: DC | PRN
Start: 2012-01-24 — End: 2012-01-24
  Administered 2012-01-24: 2 mg via INTRAVENOUS

## 2012-01-24 MED ORDER — CEFAZOLIN SODIUM-DEXTROSE 2-3 GM-% IV SOLR
INTRAVENOUS | Status: AC
  Filled 2012-01-24: qty 50

## 2012-01-24 MED ORDER — LIDOCAINE HCL (PF) 1 % IJ SOLN
INTRAMUSCULAR | Status: AC
  Filled 2012-01-24: qty 5

## 2012-01-24 MED ORDER — GLYCOPYRROLATE 0.2 MG/ML IJ SOLN
INTRAMUSCULAR | Status: AC
  Filled 2012-01-24: qty 2

## 2012-01-24 MED ORDER — POVIDONE-IODINE 10 % OINT PACKET
TOPICAL_OINTMENT | CUTANEOUS | Status: DC | PRN
  Administered 2012-01-24: 1 via TOPICAL

## 2012-01-24 MED ORDER — ROCURONIUM BROMIDE 50 MG/5ML IV SOLN
INTRAVENOUS | Status: AC
  Filled 2012-01-24: qty 1

## 2012-01-24 MED ORDER — FENTANYL CITRATE 0.05 MG/ML IJ SOLN
INTRAMUSCULAR | Status: DC | PRN
  Administered 2012-01-24 (×4): 50 ug via INTRAVENOUS

## 2012-01-24 MED ORDER — LIDOCAINE HCL 1 % IJ SOLN
INTRAMUSCULAR | Status: DC | PRN
  Administered 2012-01-24: 40 mg via INTRADERMAL

## 2012-01-24 MED ORDER — CEFAZOLIN SODIUM-DEXTROSE 2-3 GM-% IV SOLR
2.0000 g | INTRAVENOUS | Status: AC
  Administered 2012-01-24: 2 g via INTRAVENOUS

## 2012-01-24 MED ORDER — MIDAZOLAM HCL 2 MG/2ML IJ SOLN
INTRAMUSCULAR | Status: AC
  Administered 2012-01-24: 2 mg via INTRAVENOUS
  Filled 2012-01-24: qty 2

## 2012-01-24 MED ORDER — FENTANYL CITRATE 0.05 MG/ML IJ SOLN
INTRAMUSCULAR | Status: AC
  Administered 2012-01-24: 50 ug via INTRAVENOUS
  Filled 2012-01-24: qty 2

## 2012-01-24 MED ORDER — ONDANSETRON HCL 4 MG/2ML IJ SOLN: 4.0000 mg | Freq: Once | INTRAMUSCULAR | Status: DC | PRN

## 2012-01-24 MED ORDER — NEOSTIGMINE METHYLSULFATE 1 MG/ML IJ SOLN
INTRAMUSCULAR | Status: DC | PRN
  Administered 2012-01-24: 2 mg via INTRAVENOUS

## 2012-01-24 MED ORDER — OXYCODONE-ACETAMINOPHEN 5-325 MG PO TABS: 1.0000 | ORAL_TABLET | ORAL | Status: AC | PRN

## 2012-01-24 MED ORDER — BUPIVACAINE HCL (PF) 0.5 % IJ SOLN
INTRAMUSCULAR | Status: AC
  Filled 2012-01-24: qty 30

## 2012-01-24 MED ORDER — LACTATED RINGERS IV SOLN
INTRAVENOUS | Status: DC
  Administered 2012-01-24: 09:00:00 via INTRAVENOUS

## 2012-01-24 MED ORDER — PROPOFOL 10 MG/ML IV EMUL
INTRAVENOUS | Status: AC
  Filled 2012-01-24: qty 20

## 2012-01-24 MED ORDER — POVIDONE-IODINE 10 % EX OINT
TOPICAL_OINTMENT | CUTANEOUS | Status: AC
  Filled 2012-01-24: qty 2

## 2012-01-24 MED ORDER — FENTANYL CITRATE 0.05 MG/ML IJ SOLN
INTRAMUSCULAR | Status: AC
  Administered 2012-01-24: 50 ug via INTRAVENOUS
  Filled 2012-01-24: qty 5

## 2012-01-24 MED ORDER — LABETALOL HCL 5 MG/ML IV SOLN
10.0000 mg | INTRAVENOUS | Status: DC | PRN
  Administered 2012-01-24: 10 mg via INTRAVENOUS

## 2012-01-24 MED ORDER — LABETALOL HCL 5 MG/ML IV SOLN
INTRAVENOUS | Status: AC
  Administered 2012-01-24: 10 mg via INTRAVENOUS
  Filled 2012-01-24: qty 4

## 2012-01-24 MED ORDER — KETOROLAC TROMETHAMINE 30 MG/ML IJ SOLN
INTRAMUSCULAR | Status: AC
  Filled 2012-01-24: qty 1

## 2012-01-24 MED ORDER — ROCURONIUM BROMIDE 100 MG/10ML IV SOLN
INTRAVENOUS | Status: DC | PRN
  Administered 2012-01-24: 30 mg via INTRAVENOUS

## 2012-01-24 MED ORDER — SODIUM CHLORIDE 0.9 % IR SOLN
Status: DC | PRN
  Administered 2012-01-24: 1000 mL

## 2012-01-24 SURGICAL SUPPLY — 33 items
BAG HAMPER (MISCELLANEOUS) ×2 IMPLANT
CLOTH BEACON ORANGE TIMEOUT ST (SAFETY) ×2 IMPLANT
COVER LIGHT HANDLE STERIS (MISCELLANEOUS) ×4 IMPLANT
DECANTER SPIKE VIAL GLASS SM (MISCELLANEOUS) ×1 IMPLANT
DURAPREP 26ML APPLICATOR (WOUND CARE) ×2 IMPLANT
ELECT REM PT RETURN 9FT ADLT (ELECTROSURGICAL) ×2
ELECTRODE REM PT RTRN 9FT ADLT (ELECTROSURGICAL) ×1 IMPLANT
FORMALIN 10 PREFIL 120ML (MISCELLANEOUS) ×2 IMPLANT
GLOVE BIO SURGEON STRL SZ7.5 (GLOVE) ×2 IMPLANT
GLOVE BIOGEL PI IND STRL 7.0 (GLOVE) IMPLANT
GLOVE BIOGEL PI INDICATOR 7.0 (GLOVE) ×2
GLOVE ECLIPSE 6.5 STRL STRAW (GLOVE) ×1 IMPLANT
GLOVE ECLIPSE 7.0 STRL STRAW (GLOVE) ×2 IMPLANT
GOWN STRL REIN XL XLG (GOWN DISPOSABLE) ×6 IMPLANT
INST SET MAJOR GENERAL (KITS) ×2 IMPLANT
KIT ROOM TURNOVER APOR (KITS) ×2 IMPLANT
MANIFOLD NEPTUNE II (INSTRUMENTS) ×2 IMPLANT
NDL HYPO 25X1 1.5 SAFETY (NEEDLE) ×1 IMPLANT
NEEDLE HYPO 25X1 1.5 SAFETY (NEEDLE) ×2 IMPLANT
PACK ABDOMINAL MAJOR (CUSTOM PROCEDURE TRAY) ×2 IMPLANT
PAD ARMBOARD 7.5X6 YLW CONV (MISCELLANEOUS) ×2 IMPLANT
SET BASIN LINEN APH (SET/KITS/TRAYS/PACK) ×2 IMPLANT
SPONGE GAUZE 4X4 12PLY (GAUZE/BANDAGES/DRESSINGS) ×2 IMPLANT
STAPLER VISISTAT (STAPLE) ×1 IMPLANT
SUT PDS AB CT VIOLET #0 27IN (SUTURE) IMPLANT
SUT PROLENE 3 0 PS 1 (SUTURE) IMPLANT
SUT VIC AB 2-0 CT1 27 (SUTURE) ×6
SUT VIC AB 2-0 CT1 TAPERPNT 27 (SUTURE) IMPLANT
SUT VIC AB 3-0 SH 27 (SUTURE)
SUT VIC AB 3-0 SH 27X BRD (SUTURE) IMPLANT
SYR CONTROL 10ML LL (SYRINGE) ×2 IMPLANT
TAPE CLOTH SURG 4X10 WHT LF (GAUZE/BANDAGES/DRESSINGS) ×1 IMPLANT
TOWEL OR 17X26 4PK STRL BLUE (TOWEL DISPOSABLE) ×2 IMPLANT

## 2012-01-24 NOTE — Anesthesia Preprocedure Evaluation (Signed)
Anesthesia Evaluation  Patient identified by MRN, date of birth, ID band Patient awake    Reviewed: Allergy & Precautions, H&P , NPO status , Patient's Chart, lab work & pertinent test results  History of Anesthesia Complications Negative for: history of anesthetic complications  Airway Mallampati: I TM Distance: >3 FB     Dental  (+) Teeth Intact   Pulmonary former smoker breath sounds clear to auscultation        Cardiovascular hypertension, Pt. on medications Rhythm:Regular Rate:Normal     Neuro/Psych    GI/Hepatic GERD-  Medicated and Controlled,(+) Hepatitis -, C  Endo/Other  Diabetes mellitus-, Well Controlled, Type 2, Oral Hypoglycemic Agents  Renal/GU      Musculoskeletal   Abdominal   Peds  Hematology   Anesthesia Other Findings bilat corneal transplants   Reproductive/Obstetrics                           Anesthesia Physical Anesthesia Plan  ASA: III  Anesthesia Plan: General   Post-op Pain Management:    Induction: Intravenous, Rapid sequence and Cricoid pressure planned  Airway Management Planned: Oral ETT  Additional Equipment:   Intra-op Plan:   Post-operative Plan:   Informed Consent:   Plan Discussed with:   Anesthesia Plan Comments:         Anesthesia Quick Evaluation

## 2012-01-24 NOTE — Interval H&P Note (Signed)
History and Physical Interval Note:  01/24/2012 8:32 AM  Randy Lee  has presented today for surgery, with the diagnosis of abdominal wound  The various methods of treatment have been discussed with the patient and family. After consideration of risks, benefits and other options for treatment, the patient has consented to  Procedure(s) (LRB): DEBRIDEMENT ABDOMINAL WOUND (N/A) as a surgical intervention .  The patients' history has been reviewed, patient examined, no change in status, stable for surgery.  I have reviewed the patients' chart and labs.  Questions were answered to the patient's satisfaction.     Franky Macho A
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
## 2012-01-24 NOTE — Discharge Instructions (Signed)
Incision and Drainage Incision and drainage (I&D) is a procedure in which a cavity-like structure (cystic structure) is opened and drained. The cyst to be drained usually contains material such as pus, fluid, or blood. Gauze is sometimes packed into the cut (incision). Keeping a drain or piece of gauze in the incision keeps the skin from healing first. This helps stop the cyst from forming again. HOME CARE INSTRUCTIONS   Only take over-the-counter or prescription medicines for pain, discomfort, or fever as directed by your caregiver. Use these only if your caregiver has not given medicines that would interfere.   See your caregiver as directed for a recheck.   If medicines (antibiotics) that kill germs were prescribed, take them as directed.  SEEK MEDICAL CARE IF:   You develop increased pain, swelling, redness, drainage, or bleeding in the wound.   You develop signs of an infection. These signs include muscle aches, chills, or a general ill feeling.   You have a fever.  MAKE SURE YOU:   Understand these instructions.   Will watch your condition.   Will get help right away if you are not doing well or get worse.  Document Released: 01/29/2001 Document Revised: 07/25/2011 Document Reviewed: 03/25/2008 Exit[employer] Patient Information 2012 [employer]. 

## 2012-01-24 NOTE — Anesthesia Procedure Notes (Signed)
Procedure Name: Intubation Date/Time: 01/24/2012 8:56 AM Performed by: Glynn Octave E Pre-anesthesia Checklist: Patient identified, Patient being monitored, Timeout performed, Emergency Drugs available and Suction available Patient Re-evaluated:Patient Re-evaluated prior to inductionOxygen Delivery Method: Circle System Utilized Preoxygenation: Pre-oxygenation with 100% oxygen Intubation Type: IV induction, Rapid sequence and Cricoid Pressure applied Ventilation: Mask ventilation without difficulty Laryngoscope Size: Mac and 3 Grade View: Grade I Tube type: Oral Tube size: 8.0 mm Number of attempts: 1 Airway Equipment and Method: stylet Placement Confirmation: ETT inserted through vocal cords under direct vision,  positive ETCO2 and breath sounds checked- equal and bilateral Secured at: 21 cm Tube secured with: Tape Dental Injury: Teeth and Oropharynx as per pre-operative assessment

## 2012-01-24 NOTE — Transfer of Care (Signed)
Immediate Anesthesia Transfer of Care Note  Patient: Randy Lee  Procedure(s) Performed: Procedure(s) (LRB): DEBRIDEMENT ABDOMINAL WOUND (N/A)  Patient Location: PACU  Anesthesia Type: General  Level of Consciousness: awake, alert  and oriented  Airway & Oxygen Therapy: Patient Spontanous Breathing and Patient connected to face mask oxygen  Post-op Assessment: Report given to PACU RN  Post vital signs: Reviewed and stable  Complications: No apparent anesthesia complications

## 2012-01-24 NOTE — Anesthesia Postprocedure Evaluation (Signed)
  Anesthesia Post-op Note  Patient: Randy Lee  Procedure(s) Performed: Procedure(s) (LRB): DEBRIDEMENT ABDOMINAL WOUND (N/A)  Patient Location: PACU  Anesthesia Type: General  Level of Consciousness: awake, alert  and oriented  Airway and Oxygen Therapy: Patient Spontanous Breathing and Patient connected to face mask oxygen  Post-op Pain: none  Post-op Assessment: Post-op Vital signs reviewed, Patient's Cardiovascular Status Stable, Respiratory Function Stable, Patent Airway and No signs of Nausea or vomiting  Post-op Vital Signs: Reviewed and stable  Complications: No apparent anesthesia complications

## 2012-01-28 ENCOUNTER — Encounter (HOSPITAL_COMMUNITY): Payer: Self-pay | Admitting: General Surgery

## 2014-07-01 ENCOUNTER — Other Ambulatory Visit (HOSPITAL_COMMUNITY): Payer: Self-pay | Admitting: Internal Medicine

## 2014-07-01 DIAGNOSIS — B182 Chronic viral hepatitis C: Secondary | ICD-10-CM

## 2014-07-07 ENCOUNTER — Ambulatory Visit (HOSPITAL_COMMUNITY)
Admission: RE | Admit: 2014-07-07 | Discharge: 2014-07-07 | Disposition: A | Discharge status: Home / Self Care | Payer: Medicare Other | Source: Ambulatory Visit | Attending: Internal Medicine | Admitting: Internal Medicine

## 2014-07-07 DIAGNOSIS — B182 Chronic viral hepatitis C: Secondary | ICD-10-CM | POA: Diagnosis not present

## 2015-04-05 ENCOUNTER — Emergency Department: Admit: 2015-04-05 | Payer: MEDICARE | Primary: Student in an Organized Health Care Education/Training Program

## 2015-04-05 ENCOUNTER — Inpatient Hospital Stay
Admit: 2015-04-05 | Discharge: 2015-04-06 | Disposition: A | Discharge status: Home / Self Care | Payer: MEDICARE | Attending: Emergency Medicine

## 2015-04-05 DIAGNOSIS — M25511 Pain in right shoulder: Secondary | ICD-10-CM

## 2015-04-05 LAB — EKG, 12 LEAD, INITIAL
Atrial Rate: 55 {beats}/min
Calculated P Axis: 68 degrees
Calculated R Axis: 33 degrees
Calculated T Axis: 27 degrees
P-R Interval: 162 ms
Q-T Interval: 428 ms
QRS Duration: 88 ms
QTC Calculation (Bezet): 409 ms
Ventricular Rate: 55 {beats}/min

## 2015-04-05 LAB — POC TROPONIN: Troponin-I (POC): 0 ng/ml (ref 0.0–0.08)

## 2015-04-05 MED ORDER — HYDROCODONE-ACETAMINOPHEN 10 MG-325 MG TAB
10-325 mg | ORAL | Status: DC
Start: 2015-04-05 — End: 2015-04-05
  Administered 2015-04-05: 23:00:00 via ORAL

## 2015-04-05 MED FILL — HYDROCODONE-ACETAMINOPHEN 10 MG-325 MG TAB: 10-325 mg | ORAL | Qty: 1

## 2015-04-05 NOTE — ED Notes (Signed)
I have reviewed discharge instructions with the patient.  The patient verbalized understanding.
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
## 2015-04-05 NOTE — ED Triage Notes (Signed)
Patient arrives complains of right side chest, shoulder and arm pain and weakness. Patient woke up this morning with weakness of right hand.

## 2015-04-06 MED ORDER — CYCLOBENZAPRINE 5 MG TAB
5 mg | ORAL_TABLET | Freq: Three times a day (TID) | ORAL | 0 refills | Status: AC | PRN
Start: 2015-04-06 — End: ?

## 2015-04-06 MED ORDER — HYDROCODONE-ACETAMINOPHEN 7.5 MG-325 MG TAB
ORAL_TABLET | Freq: Four times a day (QID) | ORAL | 0 refills | Status: AC | PRN
Start: 2015-04-06 — End: ?

## 2015-06-13 ENCOUNTER — Emergency Department (HOSPITAL_COMMUNITY)
Admission: EM | Admit: 2015-06-13 | Discharge: 2015-06-13 | Disposition: A | Discharge status: Home / Self Care | Payer: Medicare HMO | Attending: Emergency Medicine | Admitting: Emergency Medicine

## 2015-06-13 ENCOUNTER — Encounter (HOSPITAL_COMMUNITY): Payer: Self-pay | Admitting: Emergency Medicine

## 2015-06-13 ENCOUNTER — Emergency Department (HOSPITAL_COMMUNITY): Payer: Medicare HMO

## 2015-06-13 DIAGNOSIS — I10 Essential (primary) hypertension: Secondary | ICD-10-CM | POA: Diagnosis not present

## 2015-06-13 DIAGNOSIS — Z79899 Other long term (current) drug therapy: Secondary | ICD-10-CM | POA: Diagnosis not present

## 2015-06-13 DIAGNOSIS — Z7982 Long term (current) use of aspirin: Secondary | ICD-10-CM | POA: Diagnosis not present

## 2015-06-13 DIAGNOSIS — Z794 Long term (current) use of insulin: Secondary | ICD-10-CM | POA: Diagnosis not present

## 2015-06-13 DIAGNOSIS — E119 Type 2 diabetes mellitus without complications: Secondary | ICD-10-CM | POA: Diagnosis not present

## 2015-06-13 DIAGNOSIS — K219 Gastro-esophageal reflux disease without esophagitis: Secondary | ICD-10-CM | POA: Diagnosis not present

## 2015-06-13 DIAGNOSIS — R05 Cough: Secondary | ICD-10-CM | POA: Diagnosis present

## 2015-06-13 DIAGNOSIS — H547 Unspecified visual loss: Secondary | ICD-10-CM | POA: Diagnosis not present

## 2015-06-13 DIAGNOSIS — J209 Acute bronchitis, unspecified: Secondary | ICD-10-CM | POA: Insufficient documentation

## 2015-06-13 DIAGNOSIS — J40 Bronchitis, not specified as acute or chronic: Secondary | ICD-10-CM

## 2015-06-13 MED ORDER — GUAIFENESIN-CODEINE 100-10 MG/5ML PO SYRP: 10.0000 mL | ORAL_SOLUTION | Freq: Three times a day (TID) | ORAL | Status: DC | PRN

## 2015-06-13 MED ORDER — LEVOFLOXACIN 500 MG PO TABS: 500.0000 mg | ORAL_TABLET | Freq: Every day | ORAL | Status: DC

## 2015-06-13 MED ORDER — GUAIFENESIN-CODEINE 100-10 MG/5ML PO SOLN
10.0000 mL | Freq: Once | ORAL | Status: AC
Start: 2015-06-13 — End: 2015-06-13
  Administered 2015-06-13: 10 mL via ORAL
  Filled 2015-06-13: qty 10

## 2015-06-13 MED ORDER — ALBUTEROL SULFATE HFA 108 (90 BASE) MCG/ACT IN AERS
2.0000 | INHALATION_SPRAY | Freq: Once | RESPIRATORY_TRACT | Status: AC
  Administered 2015-06-13: 2 via RESPIRATORY_TRACT
  Filled 2015-06-13: qty 6.7

## 2015-06-13 NOTE — ED Notes (Signed)
RT called for [facility].

## 2015-06-13 NOTE — ED Provider Notes (Signed)
CSN: [id number]     Arrival date & time 06/13/15  1313 History   First MD Initiated Contact with Patient 06/13/15 1341     Chief Complaint  Patient presents with  . Cough     (Consider location/radiation/quality/duration/timing/severity/associated sxs/prior Treatment) HPI   Randy Lee is a 64 y.o. male who presents to the Emergency Department complaining of productive cough for 2 weeks. He reports yellow sputum intermittently without hemoptysis.  He also reports associated nasal congestion and runny nose. He reports occasional wheezing and chest tightness with excessive coughing. He states that he had a fever earlier in the onset of his symptoms but not recently.  He is been taking over-the-counter cough and cold medications without relief. He also reports some mild mid chest pain with excessive cough. He denies shortness of breath, chills, back pain and lower extremity edema.   Past Medical History  Diagnosis Date  . Chest pain, unspecified   . Unspecified essential hypertension   . Esophageal reflux   . Acute pharyngitis   . Unspecified pruritic disorder   . Psychosexual dysfunction with inhibited sexual excitement   . Blind 2005  . Type II or unspecified type diabetes mellitus without mention of complication, not stated as uncontrolled   . Hepatitis hep c    Diagnosed last year  . Diabetes mellitus type 2, uncomplicated (HCC)     x 7 years   Past Surgical History  Procedure Laterality Date  . Eye surgery      cataracts  . Corneal transplant    . Corneal transplant      rejected  . Cervical spine surgery    . Hiatal hernia repair    . Cholecystectomy    . Liver biopsy    . Wound debridement  01/24/2012    Procedure: DEBRIDEMENT ABDOMINAL WOUND;  Surgeon: Jamesetta So, MD;  Location: [facility];  Service: General;  Laterality: N/A;   No family history on file. Social History  Substance Use Topics  . Smoking status: Never Smoker   . Smokeless tobacco: None  .  Alcohol Use: No     Comment: Quit 30 yrs ago    Review of Systems  Constitutional: Negative for fever, chills and appetite change.  HENT: Positive for congestion and rhinorrhea. Negative for sore throat and trouble swallowing.   Respiratory: Positive for cough, chest tightness and wheezing. Negative for shortness of breath.   Cardiovascular: Negative for chest pain.  Gastrointestinal: Negative for abdominal pain.  Genitourinary: Negative for dysuria.  Musculoskeletal: Negative for arthralgias, neck pain and neck stiffness.  Skin: Negative for rash.  Neurological: Negative for dizziness, weakness and numbness.  Hematological: Negative for adenopathy.  All other systems reviewed and are negative.     Allergies  Review of patient's allergies indicates no known allergies.  Home Medications   Prior to Admission medications   Medication Sig Start Date End Date Taking? Authorizing Provider  amLODipine (NORVASC) 5 MG tablet Take 5 mg by mouth daily.     Historical Provider, MD  aspirin EC 81 MG tablet Take 81 mg by mouth daily.    Historical Provider, MD  diphenhydrAMINE (BENADRYL) 25 MG tablet Take 50 mg by mouth every 6 (six) hours as needed. For itching    Historical Provider, MD  esomeprazole (NEXIUM) 40 MG capsule Take 40 mg by mouth daily before breakfast.      Historical Provider, MD  finasteride (PROSCAR) 5 MG tablet Take 5 mg by mouth daily.  Historical Provider, MD  HYDROcodone-acetaminophen (VICODIN) 5-500 MG per tablet Take 1 tablet by mouth every 4 (four) hours as needed. For pain    Historical Provider, MD  insulin NPH-insulin regular (NOVOLIN 70/30) (70-30) 100 UNIT/ML injection Inject 15-25 Units into the skin 2 (two) times daily with a meal. 25 units in morning and 15 units in the evening    Historical Provider, MD  lisinopril-hydrochlorothiazide (PRINZIDE,ZESTORETIC) 20-25 MG per tablet Take 1 tablet by mouth 2 (two) times daily.     Historical Provider, MD  metFORMIN  (GLUCOPHAGE) 1000 MG tablet Take 1,000 mg by mouth 2 (two) times daily with a meal.    Historical Provider, MD  prednisoLONE acetate (PRED FORTE) 1 % ophthalmic suspension Place 1 drop into the right eye 3 (three) times daily.    Historical Provider, MD  sildenafil (VIAGRA) 100 MG tablet Take 100 mg by mouth daily as needed. For e.d.    Historical Provider, MD  Tamsulosin HCl (FLOMAX) 0.4 MG CAPS Take 0.4 mg by mouth daily.     Historical Provider, MD  timolol (TIMOPTIC) 0.5 % ophthalmic solution Place 1 drop into both eyes 2 (two) times daily.    Historical Provider, MD   BP 142/97 mmHg  Pulse 63  Temp(Src) 97.9 F (36.6 C) (Oral)  Resp 14  Ht 5\' 9"  (1.753 m)  Wt 170 lb (77.111 kg)  BMI 25.09 kg/m2  SpO2 100%    Physical Exam  Constitutional: He is oriented to person, place, and time. He appears well-developed and well-nourished. No distress.  HENT:  Head: Normocephalic and atraumatic.  Right Ear: Tympanic membrane and ear canal normal.  Left Ear: Tympanic membrane and ear canal normal.  Nose: Mucosal edema and rhinorrhea present.  Mouth/Throat: Uvula is midline, oropharynx is clear and moist and mucous membranes are normal. No oropharyngeal exudate.  Neck: Normal range of motion, full passive range of motion without pain and phonation normal. Neck supple.  Cardiovascular: Normal rate and regular rhythm.   No murmur heard. Pulmonary/Chest: Effort normal. No stridor. No respiratory distress. He has no rales. He exhibits no tenderness.  Coarse lungs sounds bilaterally. No wheezing or rales  Abdominal: Soft. He exhibits no distension. There is no tenderness. There is no rebound.  Musculoskeletal: Normal range of motion. He exhibits no edema.  Lymphadenopathy:    He has no cervical adenopathy.  Neurological: He is alert and oriented to person, place, and time. He exhibits normal muscle tone. Coordination normal.  Skin: Skin is warm and dry.  Nursing note and vitals reviewed.   ED  Course  Procedures (including critical care time) Labs Review Labs Reviewed - No data to display  Imaging Review Dg Chest 2 View  06/13/2015  CLINICAL DATA:  Patient with 2 week history of cough. Healing greenish sputum. Shortness of breath. EXAM: CHEST  2 VIEW COMPARISON:  Chest radiograph 12/16/2014 FINDINGS: Stable cardiac and mediastinal contours. No consolidative pulmonary opacities. No pleural effusion or pneumothorax. Stable radiodensity projecting over right axillary region. IMPRESSION: No acute cardiopulmonary process. Electronically Signed   By: Lovey Newcomer M.D.   On: 06/13/2015 14:10   I have personally reviewed and evaluated these images and lab results as part of my medical decision-making.   EKG Interpretation None       EKG reviewed by Dr. Jeneen Rinks MDM   Final diagnoses:  Bronchitis    Patient well appearing. Vital signs are stable. Wells score is 0.  Patient also seen by Dr. Jeneen Rinks in care  plan discussed. Patient agrees to treatment with Levaquin, guaifenesin with codeine and albuterol inhaler. He appears stable for discharge.  Advised to return to the ER for any worsening symptoms and to follow-up with his PMD.    Kem Parkinson, PA-C 06/13/15 [place], PA-C 06/13/15 1446  Tanna Furry, MD 06/21/15 [phone]

## 2015-06-13 NOTE — ED Provider Notes (Signed)
Patient seen and examined. History reviewed. Reports coughing for 2 weeks. Now some discomfort with cough. Does not have anginal type pain or pressure. No swelling in his extremities. Cough productive of some yellow sputum. No blood. X-ray shows no acute findings. EKG shows no acute or ischemic changes no injury or ectopy. On exam his lungs are clear and frequent cough. No marked prolongation or wheezing. Agree with disposition with cost suppressant, M.D. I albuterol to use as needed, and without a prescription. Primary care follow-up. ER with acute changes.  Tanna Furry, MD 06/13/15 [phone]

## 2015-06-13 NOTE — ED Notes (Signed)
Patient with c/o cough x 2 weeks. Yellow sputum per patient. Patient with noted cough in triage. Non-productive at this time.

## 2018-05-29 DIAGNOSIS — H04123 Dry eye syndrome of bilateral lacrimal glands: Secondary | ICD-10-CM | POA: Diagnosis not present

## 2018-05-29 DIAGNOSIS — Z947 Corneal transplant status: Secondary | ICD-10-CM | POA: Diagnosis not present

## 2018-06-10 DIAGNOSIS — E7800 Pure hypercholesterolemia, unspecified: Secondary | ICD-10-CM | POA: Diagnosis not present

## 2018-06-10 DIAGNOSIS — I10 Essential (primary) hypertension: Secondary | ICD-10-CM | POA: Diagnosis not present

## 2018-06-10 DIAGNOSIS — E119 Type 2 diabetes mellitus without complications: Secondary | ICD-10-CM | POA: Diagnosis not present

## 2018-07-08 DIAGNOSIS — E119 Type 2 diabetes mellitus without complications: Secondary | ICD-10-CM | POA: Diagnosis not present

## 2018-07-08 DIAGNOSIS — I10 Essential (primary) hypertension: Secondary | ICD-10-CM | POA: Diagnosis not present

## 2018-07-08 DIAGNOSIS — E7800 Pure hypercholesterolemia, unspecified: Secondary | ICD-10-CM | POA: Diagnosis not present

## 2018-08-04 DIAGNOSIS — E7800 Pure hypercholesterolemia, unspecified: Secondary | ICD-10-CM | POA: Diagnosis not present

## 2018-08-04 DIAGNOSIS — E119 Type 2 diabetes mellitus without complications: Secondary | ICD-10-CM | POA: Diagnosis not present

## 2018-08-04 DIAGNOSIS — I10 Essential (primary) hypertension: Secondary | ICD-10-CM | POA: Diagnosis not present

## 2018-08-20 DIAGNOSIS — E1142 Type 2 diabetes mellitus with diabetic polyneuropathy: Secondary | ICD-10-CM | POA: Diagnosis not present

## 2018-08-20 DIAGNOSIS — Z87891 Personal history of nicotine dependence: Secondary | ICD-10-CM | POA: Diagnosis not present

## 2018-08-20 DIAGNOSIS — Z6828 Body mass index (BMI) 28.0-28.9, adult: Secondary | ICD-10-CM | POA: Diagnosis not present

## 2018-08-20 DIAGNOSIS — L989 Disorder of the skin and subcutaneous tissue, unspecified: Secondary | ICD-10-CM | POA: Diagnosis not present

## 2018-08-20 DIAGNOSIS — M5431 Sciatica, right side: Secondary | ICD-10-CM | POA: Diagnosis not present

## 2018-08-20 DIAGNOSIS — I10 Essential (primary) hypertension: Secondary | ICD-10-CM | POA: Diagnosis not present

## 2018-08-20 DIAGNOSIS — Z2821 Immunization not carried out because of patient refusal: Secondary | ICD-10-CM | POA: Diagnosis not present

## 2018-08-20 DIAGNOSIS — Z299 Encounter for prophylactic measures, unspecified: Secondary | ICD-10-CM | POA: Diagnosis not present

## 2018-08-20 DIAGNOSIS — E1165 Type 2 diabetes mellitus with hyperglycemia: Secondary | ICD-10-CM | POA: Diagnosis not present

## 2018-08-26 DIAGNOSIS — T8189XA Other complications of procedures, not elsewhere classified, initial encounter: Secondary | ICD-10-CM | POA: Diagnosis not present

## 2018-09-03 DIAGNOSIS — T8189XA Other complications of procedures, not elsewhere classified, initial encounter: Secondary | ICD-10-CM | POA: Diagnosis not present

## 2018-09-03 DIAGNOSIS — L929 Granulomatous disorder of the skin and subcutaneous tissue, unspecified: Secondary | ICD-10-CM | POA: Diagnosis not present

## 2018-09-22 DIAGNOSIS — Z947 Corneal transplant status: Secondary | ICD-10-CM | POA: Diagnosis not present

## 2018-09-22 DIAGNOSIS — H401133 Primary open-angle glaucoma, bilateral, severe stage: Secondary | ICD-10-CM | POA: Diagnosis not present

## 2018-09-22 DIAGNOSIS — H04123 Dry eye syndrome of bilateral lacrimal glands: Secondary | ICD-10-CM | POA: Diagnosis not present

## 2018-09-29 DIAGNOSIS — E119 Type 2 diabetes mellitus without complications: Secondary | ICD-10-CM | POA: Diagnosis not present

## 2018-09-29 DIAGNOSIS — I10 Essential (primary) hypertension: Secondary | ICD-10-CM | POA: Diagnosis not present

## 2018-09-29 DIAGNOSIS — E7800 Pure hypercholesterolemia, unspecified: Secondary | ICD-10-CM | POA: Diagnosis not present

## 2018-10-01 DIAGNOSIS — H401133 Primary open-angle glaucoma, bilateral, severe stage: Secondary | ICD-10-CM | POA: Diagnosis not present

## 2018-10-01 DIAGNOSIS — Z947 Corneal transplant status: Secondary | ICD-10-CM | POA: Diagnosis not present

## 2018-10-01 DIAGNOSIS — H04123 Dry eye syndrome of bilateral lacrimal glands: Secondary | ICD-10-CM | POA: Diagnosis not present

## 2018-10-06 DIAGNOSIS — I10 Essential (primary) hypertension: Secondary | ICD-10-CM | POA: Diagnosis not present

## 2018-10-06 DIAGNOSIS — Z6827 Body mass index (BMI) 27.0-27.9, adult: Secondary | ICD-10-CM | POA: Diagnosis not present

## 2018-10-06 DIAGNOSIS — E1165 Type 2 diabetes mellitus with hyperglycemia: Secondary | ICD-10-CM | POA: Diagnosis not present

## 2018-10-06 DIAGNOSIS — E1142 Type 2 diabetes mellitus with diabetic polyneuropathy: Secondary | ICD-10-CM | POA: Diagnosis not present

## 2018-10-06 DIAGNOSIS — Z299 Encounter for prophylactic measures, unspecified: Secondary | ICD-10-CM | POA: Diagnosis not present

## 2018-10-06 DIAGNOSIS — Z79899 Other long term (current) drug therapy: Secondary | ICD-10-CM | POA: Diagnosis not present

## 2018-10-06 DIAGNOSIS — M479 Spondylosis, unspecified: Secondary | ICD-10-CM | POA: Diagnosis not present

## 2018-10-26 DIAGNOSIS — I10 Essential (primary) hypertension: Secondary | ICD-10-CM | POA: Diagnosis not present

## 2018-10-26 DIAGNOSIS — E7800 Pure hypercholesterolemia, unspecified: Secondary | ICD-10-CM | POA: Diagnosis not present

## 2018-10-26 DIAGNOSIS — E119 Type 2 diabetes mellitus without complications: Secondary | ICD-10-CM | POA: Diagnosis not present

## 2018-11-27 DIAGNOSIS — E119 Type 2 diabetes mellitus without complications: Secondary | ICD-10-CM | POA: Diagnosis not present

## 2018-11-27 DIAGNOSIS — E7800 Pure hypercholesterolemia, unspecified: Secondary | ICD-10-CM | POA: Diagnosis not present

## 2018-11-27 DIAGNOSIS — I10 Essential (primary) hypertension: Secondary | ICD-10-CM | POA: Diagnosis not present

## 2018-12-15 DIAGNOSIS — Z6827 Body mass index (BMI) 27.0-27.9, adult: Secondary | ICD-10-CM | POA: Diagnosis not present

## 2018-12-15 DIAGNOSIS — M25551 Pain in right hip: Secondary | ICD-10-CM | POA: Diagnosis not present

## 2018-12-15 DIAGNOSIS — E1165 Type 2 diabetes mellitus with hyperglycemia: Secondary | ICD-10-CM | POA: Diagnosis not present

## 2018-12-15 DIAGNOSIS — I10 Essential (primary) hypertension: Secondary | ICD-10-CM | POA: Diagnosis not present

## 2018-12-15 DIAGNOSIS — M5431 Sciatica, right side: Secondary | ICD-10-CM | POA: Diagnosis not present

## 2018-12-15 DIAGNOSIS — E1142 Type 2 diabetes mellitus with diabetic polyneuropathy: Secondary | ICD-10-CM | POA: Diagnosis not present

## 2018-12-15 DIAGNOSIS — Z299 Encounter for prophylactic measures, unspecified: Secondary | ICD-10-CM | POA: Diagnosis not present

## 2018-12-29 DIAGNOSIS — N400 Enlarged prostate without lower urinary tract symptoms: Secondary | ICD-10-CM | POA: Diagnosis not present

## 2018-12-29 DIAGNOSIS — M5030 Other cervical disc degeneration, unspecified cervical region: Secondary | ICD-10-CM | POA: Diagnosis not present

## 2018-12-29 DIAGNOSIS — E7800 Pure hypercholesterolemia, unspecified: Secondary | ICD-10-CM | POA: Diagnosis not present

## 2018-12-29 DIAGNOSIS — I10 Essential (primary) hypertension: Secondary | ICD-10-CM | POA: Diagnosis not present

## 2018-12-29 DIAGNOSIS — H04123 Dry eye syndrome of bilateral lacrimal glands: Secondary | ICD-10-CM | POA: Diagnosis not present

## 2018-12-29 DIAGNOSIS — E1142 Type 2 diabetes mellitus with diabetic polyneuropathy: Secondary | ICD-10-CM | POA: Diagnosis not present

## 2018-12-29 DIAGNOSIS — Z970 Presence of artificial eye: Secondary | ICD-10-CM | POA: Diagnosis not present

## 2018-12-29 DIAGNOSIS — H409 Unspecified glaucoma: Secondary | ICD-10-CM | POA: Diagnosis not present

## 2018-12-29 DIAGNOSIS — M479 Spondylosis, unspecified: Secondary | ICD-10-CM | POA: Diagnosis not present

## 2018-12-29 DIAGNOSIS — H401133 Primary open-angle glaucoma, bilateral, severe stage: Secondary | ICD-10-CM | POA: Diagnosis not present

## 2018-12-30 DIAGNOSIS — E119 Type 2 diabetes mellitus without complications: Secondary | ICD-10-CM | POA: Diagnosis not present

## 2018-12-30 DIAGNOSIS — E7800 Pure hypercholesterolemia, unspecified: Secondary | ICD-10-CM | POA: Diagnosis not present

## 2018-12-30 DIAGNOSIS — I10 Essential (primary) hypertension: Secondary | ICD-10-CM | POA: Diagnosis not present

## 2019-01-29 DIAGNOSIS — E7800 Pure hypercholesterolemia, unspecified: Secondary | ICD-10-CM | POA: Diagnosis not present

## 2019-01-29 DIAGNOSIS — I10 Essential (primary) hypertension: Secondary | ICD-10-CM | POA: Diagnosis not present

## 2019-01-29 DIAGNOSIS — E119 Type 2 diabetes mellitus without complications: Secondary | ICD-10-CM | POA: Diagnosis not present

## 2019-02-09 DIAGNOSIS — E7800 Pure hypercholesterolemia, unspecified: Secondary | ICD-10-CM | POA: Diagnosis not present

## 2019-02-09 DIAGNOSIS — Z0000 Encounter for general adult medical examination without abnormal findings: Secondary | ICD-10-CM | POA: Diagnosis not present

## 2019-02-09 DIAGNOSIS — Z1331 Encounter for screening for depression: Secondary | ICD-10-CM | POA: Diagnosis not present

## 2019-02-09 DIAGNOSIS — Z1211 Encounter for screening for malignant neoplasm of colon: Secondary | ICD-10-CM | POA: Diagnosis not present

## 2019-02-09 DIAGNOSIS — Z79899 Other long term (current) drug therapy: Secondary | ICD-10-CM | POA: Diagnosis not present

## 2019-02-09 DIAGNOSIS — E1165 Type 2 diabetes mellitus with hyperglycemia: Secondary | ICD-10-CM | POA: Diagnosis not present

## 2019-02-09 DIAGNOSIS — Z1339 Encounter for screening examination for other mental health and behavioral disorders: Secondary | ICD-10-CM | POA: Diagnosis not present

## 2019-02-09 DIAGNOSIS — Z7189 Other specified counseling: Secondary | ICD-10-CM | POA: Diagnosis not present

## 2019-02-09 DIAGNOSIS — L0231 Cutaneous abscess of buttock: Secondary | ICD-10-CM | POA: Diagnosis not present

## 2019-02-09 DIAGNOSIS — R5383 Other fatigue: Secondary | ICD-10-CM | POA: Diagnosis not present

## 2019-02-09 DIAGNOSIS — Z125 Encounter for screening for malignant neoplasm of prostate: Secondary | ICD-10-CM | POA: Diagnosis not present

## 2019-03-09 DIAGNOSIS — E7800 Pure hypercholesterolemia, unspecified: Secondary | ICD-10-CM | POA: Diagnosis not present

## 2019-03-09 DIAGNOSIS — E119 Type 2 diabetes mellitus without complications: Secondary | ICD-10-CM | POA: Diagnosis not present

## 2019-03-09 DIAGNOSIS — I10 Essential (primary) hypertension: Secondary | ICD-10-CM | POA: Diagnosis not present

## 2019-03-11 DIAGNOSIS — Z20828 Contact with and (suspected) exposure to other viral communicable diseases: Secondary | ICD-10-CM | POA: Diagnosis not present

## 2019-03-11 DIAGNOSIS — J22 Unspecified acute lower respiratory infection: Secondary | ICD-10-CM | POA: Diagnosis not present

## 2019-03-22 DIAGNOSIS — I10 Essential (primary) hypertension: Secondary | ICD-10-CM | POA: Diagnosis not present

## 2019-03-22 DIAGNOSIS — R262 Difficulty in walking, not elsewhere classified: Secondary | ICD-10-CM | POA: Diagnosis not present

## 2019-03-22 DIAGNOSIS — M549 Dorsalgia, unspecified: Secondary | ICD-10-CM | POA: Diagnosis not present

## 2019-03-22 DIAGNOSIS — Z789 Other specified health status: Secondary | ICD-10-CM | POA: Diagnosis not present

## 2019-03-22 DIAGNOSIS — M25551 Pain in right hip: Secondary | ICD-10-CM | POA: Diagnosis not present

## 2019-03-22 DIAGNOSIS — E1165 Type 2 diabetes mellitus with hyperglycemia: Secondary | ICD-10-CM | POA: Diagnosis not present

## 2019-03-22 DIAGNOSIS — Z6827 Body mass index (BMI) 27.0-27.9, adult: Secondary | ICD-10-CM | POA: Diagnosis not present

## 2019-03-22 DIAGNOSIS — Z299 Encounter for prophylactic measures, unspecified: Secondary | ICD-10-CM | POA: Diagnosis not present

## 2019-03-22 DIAGNOSIS — M1611 Unilateral primary osteoarthritis, right hip: Secondary | ICD-10-CM | POA: Diagnosis not present

## 2019-03-22 DIAGNOSIS — M545 Low back pain: Secondary | ICD-10-CM | POA: Diagnosis not present

## 2019-04-01 DIAGNOSIS — N401 Enlarged prostate with lower urinary tract symptoms: Secondary | ICD-10-CM | POA: Diagnosis not present

## 2019-04-01 DIAGNOSIS — R972 Elevated prostate specific antigen [PSA]: Secondary | ICD-10-CM | POA: Diagnosis not present

## 2019-04-08 DIAGNOSIS — I10 Essential (primary) hypertension: Secondary | ICD-10-CM | POA: Diagnosis not present

## 2019-04-08 DIAGNOSIS — E119 Type 2 diabetes mellitus without complications: Secondary | ICD-10-CM | POA: Diagnosis not present

## 2019-04-08 DIAGNOSIS — E7800 Pure hypercholesterolemia, unspecified: Secondary | ICD-10-CM | POA: Diagnosis not present

## 2019-04-20 DIAGNOSIS — Z299 Encounter for prophylactic measures, unspecified: Secondary | ICD-10-CM | POA: Diagnosis not present

## 2019-04-20 DIAGNOSIS — I10 Essential (primary) hypertension: Secondary | ICD-10-CM | POA: Diagnosis not present

## 2019-04-20 DIAGNOSIS — Z6827 Body mass index (BMI) 27.0-27.9, adult: Secondary | ICD-10-CM | POA: Diagnosis not present

## 2019-04-20 DIAGNOSIS — M7070 Other bursitis of hip, unspecified hip: Secondary | ICD-10-CM | POA: Diagnosis not present

## 2019-04-20 DIAGNOSIS — E1165 Type 2 diabetes mellitus with hyperglycemia: Secondary | ICD-10-CM | POA: Diagnosis not present

## 2019-04-20 DIAGNOSIS — E1142 Type 2 diabetes mellitus with diabetic polyneuropathy: Secondary | ICD-10-CM | POA: Diagnosis not present

## 2019-04-29 DIAGNOSIS — R972 Elevated prostate specific antigen [PSA]: Secondary | ICD-10-CM | POA: Diagnosis not present

## 2019-04-29 DIAGNOSIS — N401 Enlarged prostate with lower urinary tract symptoms: Secondary | ICD-10-CM | POA: Diagnosis not present

## 2019-05-04 DIAGNOSIS — E7800 Pure hypercholesterolemia, unspecified: Secondary | ICD-10-CM | POA: Diagnosis not present

## 2019-05-04 DIAGNOSIS — E119 Type 2 diabetes mellitus without complications: Secondary | ICD-10-CM | POA: Diagnosis not present

## 2019-05-04 DIAGNOSIS — I10 Essential (primary) hypertension: Secondary | ICD-10-CM | POA: Diagnosis not present

## 2019-05-25 DIAGNOSIS — Z01818 Encounter for other preprocedural examination: Secondary | ICD-10-CM | POA: Diagnosis not present

## 2019-05-25 DIAGNOSIS — R972 Elevated prostate specific antigen [PSA]: Secondary | ICD-10-CM | POA: Diagnosis not present

## 2019-05-31 DIAGNOSIS — E7800 Pure hypercholesterolemia, unspecified: Secondary | ICD-10-CM | POA: Diagnosis not present

## 2019-05-31 DIAGNOSIS — E119 Type 2 diabetes mellitus without complications: Secondary | ICD-10-CM | POA: Diagnosis not present

## 2019-05-31 DIAGNOSIS — I10 Essential (primary) hypertension: Secondary | ICD-10-CM | POA: Diagnosis not present

## 2019-06-07 DIAGNOSIS — M25551 Pain in right hip: Secondary | ICD-10-CM | POA: Diagnosis not present

## 2019-06-07 DIAGNOSIS — I739 Peripheral vascular disease, unspecified: Secondary | ICD-10-CM | POA: Diagnosis not present

## 2019-06-07 DIAGNOSIS — E1165 Type 2 diabetes mellitus with hyperglycemia: Secondary | ICD-10-CM | POA: Diagnosis not present

## 2019-06-07 DIAGNOSIS — Z6828 Body mass index (BMI) 28.0-28.9, adult: Secondary | ICD-10-CM | POA: Diagnosis not present

## 2019-06-07 DIAGNOSIS — I10 Essential (primary) hypertension: Secondary | ICD-10-CM | POA: Diagnosis not present

## 2019-06-07 DIAGNOSIS — Z299 Encounter for prophylactic measures, unspecified: Secondary | ICD-10-CM | POA: Diagnosis not present

## 2019-06-07 DIAGNOSIS — R972 Elevated prostate specific antigen [PSA]: Secondary | ICD-10-CM | POA: Diagnosis not present

## 2019-06-14 DIAGNOSIS — R972 Elevated prostate specific antigen [PSA]: Secondary | ICD-10-CM | POA: Diagnosis not present

## 2019-06-14 DIAGNOSIS — N4232 Atypical small acinar proliferation of prostate: Secondary | ICD-10-CM | POA: Diagnosis not present

## 2019-06-14 DIAGNOSIS — C61 Malignant neoplasm of prostate: Secondary | ICD-10-CM | POA: Diagnosis not present

## 2019-06-30 DIAGNOSIS — C801 Malignant (primary) neoplasm, unspecified: Secondary | ICD-10-CM

## 2019-06-30 HISTORY — DX: Malignant (primary) neoplasm, unspecified: C80.1

## 2019-07-06 DIAGNOSIS — C61 Malignant neoplasm of prostate: Secondary | ICD-10-CM | POA: Diagnosis not present

## 2019-07-06 DIAGNOSIS — I739 Peripheral vascular disease, unspecified: Secondary | ICD-10-CM | POA: Diagnosis not present

## 2019-07-06 DIAGNOSIS — Z6828 Body mass index (BMI) 28.0-28.9, adult: Secondary | ICD-10-CM | POA: Diagnosis not present

## 2019-07-06 DIAGNOSIS — Z299 Encounter for prophylactic measures, unspecified: Secondary | ICD-10-CM | POA: Diagnosis not present

## 2019-07-06 DIAGNOSIS — H4020X0 Unspecified primary angle-closure glaucoma, stage unspecified: Secondary | ICD-10-CM | POA: Diagnosis not present

## 2019-07-06 DIAGNOSIS — I10 Essential (primary) hypertension: Secondary | ICD-10-CM | POA: Diagnosis not present

## 2019-07-09 DIAGNOSIS — I10 Essential (primary) hypertension: Secondary | ICD-10-CM | POA: Diagnosis not present

## 2019-07-09 DIAGNOSIS — E7800 Pure hypercholesterolemia, unspecified: Secondary | ICD-10-CM | POA: Diagnosis not present

## 2019-07-09 DIAGNOSIS — E119 Type 2 diabetes mellitus without complications: Secondary | ICD-10-CM | POA: Diagnosis not present

## 2019-07-13 DIAGNOSIS — R937 Abnormal findings on diagnostic imaging of other parts of musculoskeletal system: Secondary | ICD-10-CM | POA: Diagnosis not present

## 2019-07-13 DIAGNOSIS — C61 Malignant neoplasm of prostate: Secondary | ICD-10-CM | POA: Diagnosis not present

## 2019-07-13 DIAGNOSIS — Z9889 Other specified postprocedural states: Secondary | ICD-10-CM | POA: Diagnosis not present

## 2019-07-16 DIAGNOSIS — J22 Unspecified acute lower respiratory infection: Secondary | ICD-10-CM | POA: Diagnosis not present

## 2019-07-16 DIAGNOSIS — U071 COVID-19: Secondary | ICD-10-CM | POA: Diagnosis not present

## 2019-07-19 ENCOUNTER — Emergency Department (HOSPITAL_COMMUNITY): Payer: Medicare PPO

## 2019-07-19 ENCOUNTER — Encounter (HOSPITAL_COMMUNITY): Payer: Self-pay | Admitting: Emergency Medicine

## 2019-07-19 ENCOUNTER — Inpatient Hospital Stay (HOSPITAL_COMMUNITY)
Admission: EM | Admit: 2019-07-19 | Discharge: 2019-07-29 | DRG: 177 | Disposition: A | Discharge status: Skilled Nursing Facility | Payer: Medicare PPO | Attending: Family Medicine | Admitting: Family Medicine

## 2019-07-19 DIAGNOSIS — E119 Type 2 diabetes mellitus without complications: Secondary | ICD-10-CM | POA: Diagnosis not present

## 2019-07-19 DIAGNOSIS — R0603 Acute respiratory distress: Secondary | ICD-10-CM | POA: Diagnosis not present

## 2019-07-19 DIAGNOSIS — R05 Cough: Secondary | ICD-10-CM | POA: Diagnosis not present

## 2019-07-19 DIAGNOSIS — M2550 Pain in unspecified joint: Secondary | ICD-10-CM | POA: Diagnosis not present

## 2019-07-19 DIAGNOSIS — Z7982 Long term (current) use of aspirin: Secondary | ICD-10-CM | POA: Diagnosis not present

## 2019-07-19 DIAGNOSIS — R918 Other nonspecific abnormal finding of lung field: Secondary | ICD-10-CM | POA: Diagnosis not present

## 2019-07-19 DIAGNOSIS — Z794 Long term (current) use of insulin: Secondary | ICD-10-CM

## 2019-07-19 DIAGNOSIS — Z947 Corneal transplant status: Secondary | ICD-10-CM

## 2019-07-19 DIAGNOSIS — R509 Fever, unspecified: Secondary | ICD-10-CM | POA: Diagnosis not present

## 2019-07-19 DIAGNOSIS — Z7401 Bed confinement status: Secondary | ICD-10-CM | POA: Diagnosis not present

## 2019-07-19 DIAGNOSIS — E1165 Type 2 diabetes mellitus with hyperglycemia: Secondary | ICD-10-CM | POA: Diagnosis present

## 2019-07-19 DIAGNOSIS — I129 Hypertensive chronic kidney disease with stage 1 through stage 4 chronic kidney disease, or unspecified chronic kidney disease: Secondary | ICD-10-CM | POA: Diagnosis not present

## 2019-07-19 DIAGNOSIS — I10 Essential (primary) hypertension: Secondary | ICD-10-CM | POA: Diagnosis not present

## 2019-07-19 DIAGNOSIS — E1122 Type 2 diabetes mellitus with diabetic chronic kidney disease: Secondary | ICD-10-CM | POA: Diagnosis present

## 2019-07-19 DIAGNOSIS — H409 Unspecified glaucoma: Secondary | ICD-10-CM | POA: Diagnosis not present

## 2019-07-19 DIAGNOSIS — U071 COVID-19: Secondary | ICD-10-CM

## 2019-07-19 DIAGNOSIS — N1831 Chronic kidney disease, stage 3a: Secondary | ICD-10-CM | POA: Diagnosis present

## 2019-07-19 DIAGNOSIS — J1281 Pneumonia due to SARS-associated coronavirus: Secondary | ICD-10-CM | POA: Diagnosis not present

## 2019-07-19 DIAGNOSIS — R0902 Hypoxemia: Secondary | ICD-10-CM

## 2019-07-19 DIAGNOSIS — N400 Enlarged prostate without lower urinary tract symptoms: Secondary | ICD-10-CM | POA: Diagnosis present

## 2019-07-19 DIAGNOSIS — J9600 Acute respiratory failure, unspecified whether with hypoxia or hypercapnia: Secondary | ICD-10-CM | POA: Diagnosis not present

## 2019-07-19 DIAGNOSIS — Z8546 Personal history of malignant neoplasm of prostate: Secondary | ICD-10-CM

## 2019-07-19 DIAGNOSIS — C61 Malignant neoplasm of prostate: Secondary | ICD-10-CM | POA: Diagnosis not present

## 2019-07-19 DIAGNOSIS — Z79899 Other long term (current) drug therapy: Secondary | ICD-10-CM

## 2019-07-19 DIAGNOSIS — K219 Gastro-esophageal reflux disease without esophagitis: Secondary | ICD-10-CM | POA: Diagnosis not present

## 2019-07-19 DIAGNOSIS — H548 Legal blindness, as defined in USA: Secondary | ICD-10-CM | POA: Diagnosis not present

## 2019-07-19 DIAGNOSIS — Z79891 Long term (current) use of opiate analgesic: Secondary | ICD-10-CM | POA: Diagnosis not present

## 2019-07-19 DIAGNOSIS — B1920 Unspecified viral hepatitis C without hepatic coma: Secondary | ICD-10-CM | POA: Diagnosis present

## 2019-07-19 DIAGNOSIS — N401 Enlarged prostate with lower urinary tract symptoms: Secondary | ICD-10-CM | POA: Diagnosis not present

## 2019-07-19 DIAGNOSIS — J1289 Other viral pneumonia: Secondary | ICD-10-CM | POA: Diagnosis not present

## 2019-07-19 DIAGNOSIS — R0602 Shortness of breath: Secondary | ICD-10-CM | POA: Diagnosis not present

## 2019-07-19 DIAGNOSIS — J9601 Acute respiratory failure with hypoxia: Secondary | ICD-10-CM | POA: Diagnosis not present

## 2019-07-19 DIAGNOSIS — J1282 Pneumonia due to coronavirus disease 2019: Secondary | ICD-10-CM | POA: Diagnosis present

## 2019-07-19 HISTORY — DX: Dyspnea, unspecified: R06.00

## 2019-07-19 LAB — FERRITIN: Ferritin: 249 ng/mL (ref 24–336)

## 2019-07-19 LAB — CBC WITH DIFFERENTIAL/PLATELET
Abs Immature Granulocytes: 0.02 10*3/uL (ref 0.00–0.07)
Basophils Absolute: 0 10*3/uL (ref 0.0–0.1)
Basophils Relative: 0 %
Eosinophils Absolute: 0 10*3/uL (ref 0.0–0.5)
Eosinophils Relative: 0 %
HCT: 39.5 % (ref 39.0–52.0)
Hemoglobin: 13.1 g/dL (ref 13.0–17.0)
Immature Granulocytes: 0 %
Lymphocytes Relative: 15 %
Lymphs Abs: 1.2 10*3/uL (ref 0.7–4.0)
MCH: 29.7 pg (ref 26.0–34.0)
MCHC: 33.2 g/dL (ref 30.0–36.0)
MCV: 89.6 fL (ref 80.0–100.0)
Monocytes Absolute: 0.4 10*3/uL (ref 0.1–1.0)
Monocytes Relative: 5 %
Neutro Abs: 6.2 10*3/uL (ref 1.7–7.7)
Neutrophils Relative %: 80 %
Platelets: 171 10*3/uL (ref 150–400)
RBC: 4.41 MIL/uL (ref 4.22–5.81)
RDW: 12.4 % (ref 11.5–15.5)
WBC: 7.8 10*3/uL (ref 4.0–10.5)
nRBC: 0 % (ref 0.0–0.2)

## 2019-07-19 LAB — COMPREHENSIVE METABOLIC PANEL
ALT: 35 U/L (ref 0–44)
AST: 43 U/L — ABNORMAL HIGH (ref 15–41)
Albumin: 3.6 g/dL (ref 3.5–5.0)
Alkaline Phosphatase: 40 U/L (ref 38–126)
Anion gap: 10 (ref 5–15)
BUN: 21 mg/dL (ref 8–23)
CO2: 25 mmol/L (ref 22–32)
Calcium: 9 mg/dL (ref 8.9–10.3)
Chloride: 95 mmol/L — ABNORMAL LOW (ref 98–111)
Creatinine, Ser: 1.09 mg/dL (ref 0.61–1.24)
GFR calc Af Amer: >60 mL/min (ref >60)
GFR calc non Af Amer: >60 mL/min (ref >60)
Glucose, Bld: 191 mg/dL — ABNORMAL HIGH (ref 70–99)
Potassium: 3.9 mmol/L (ref 3.5–5.1)
Sodium: 130 mmol/L — ABNORMAL LOW (ref 135–145)
Total Bilirubin: 1 mg/dL (ref 0.3–1.2)
Total Protein: 7.9 g/dL (ref 6.5–8.1)

## 2019-07-19 LAB — C-REACTIVE PROTEIN: CRP: 10.4 mg/dL — ABNORMAL HIGH (ref <1.0)

## 2019-07-19 LAB — POC SARS CORONAVIRUS 2 AG -  ED: SARS Coronavirus 2 Ag: POSITIVE — AB

## 2019-07-19 LAB — PROCALCITONIN: Procalcitonin: 0.1 ng/mL

## 2019-07-19 LAB — LACTIC ACID, PLASMA
Lactic Acid, Venous: 1.1 mmol/L (ref 0.5–1.9)
Lactic Acid, Venous: 1.2 mmol/L (ref 0.5–1.9)

## 2019-07-19 LAB — TRIGLYCERIDES: Triglycerides: 108 mg/dL (ref <150)

## 2019-07-19 LAB — D-DIMER, QUANTITATIVE: D-Dimer, Quant: 1.12 ug/mL-FEU — ABNORMAL HIGH (ref 0.00–0.50)

## 2019-07-19 LAB — TROPONIN I (HIGH SENSITIVITY): Troponin I (High Sensitivity): 21 ng/L — ABNORMAL HIGH (ref <18)

## 2019-07-19 LAB — LACTATE DEHYDROGENASE: LDH: 254 U/L — ABNORMAL HIGH (ref 98–192)

## 2019-07-19 LAB — FIBRINOGEN: Fibrinogen: 672 mg/dL — ABNORMAL HIGH (ref 210–475)

## 2019-07-19 MED ORDER — PREDNISOLONE ACETATE 1 % OP SUSP
1.0000 [drp] | Freq: Two times a day (BID) | OPHTHALMIC | Status: DC
  Administered 2019-07-20 – 2019-07-29 (×19): 1 [drp] via OPHTHALMIC
  Filled 2019-07-19: qty 1

## 2019-07-19 MED ORDER — OXYBUTYNIN CHLORIDE 5 MG PO TABS
5.0000 mg | ORAL_TABLET | Freq: Every evening | ORAL | Status: DC
  Administered 2019-07-20 – 2019-07-28 (×9): 5 mg via ORAL
  Filled 2019-07-19 (×11): qty 1

## 2019-07-19 MED ORDER — SODIUM CHLORIDE 0.9 % IV SOLN
200.0000 mg | Freq: Once | INTRAVENOUS | Status: AC
  Administered 2019-07-19: 200 mg via INTRAVENOUS
  Filled 2019-07-19: qty 40

## 2019-07-19 MED ORDER — AMLODIPINE BESYLATE 5 MG PO TABS
5.0000 mg | ORAL_TABLET | Freq: Every morning | ORAL | Status: DC
  Administered 2019-07-20: 5 mg via ORAL
  Filled 2019-07-19: qty 1

## 2019-07-19 MED ORDER — ACETAMINOPHEN 325 MG PO TABS
650.0000 mg | ORAL_TABLET | Freq: Once | ORAL | Status: AC
  Administered 2019-07-19: 650 mg via ORAL
  Filled 2019-07-19: qty 2

## 2019-07-19 MED ORDER — ENOXAPARIN SODIUM 40 MG/0.4ML ~~LOC~~ SOLN
40.0000 mg | SUBCUTANEOUS | Status: DC
  Administered 2019-07-20 – 2019-07-28 (×10): 40 mg via SUBCUTANEOUS
  Filled 2019-07-19 (×10): qty 0.4

## 2019-07-19 MED ORDER — SODIUM CHLORIDE 0.9% FLUSH
3.0000 mL | Freq: Two times a day (BID) | INTRAVENOUS | Status: DC
  Administered 2019-07-20 – 2019-07-29 (×20): 3 mL via INTRAVENOUS

## 2019-07-19 MED ORDER — ALBUTEROL SULFATE HFA 108 (90 BASE) MCG/ACT IN AERS
6.0000 | INHALATION_SPRAY | RESPIRATORY_TRACT | Status: AC
  Administered 2019-07-19 (×2): 6 via RESPIRATORY_TRACT
  Filled 2019-07-19: qty 6.7

## 2019-07-19 MED ORDER — LISINOPRIL-HYDROCHLOROTHIAZIDE 20-25 MG PO TABS: 1.0000 | ORAL_TABLET | Freq: Two times a day (BID) | ORAL | Status: DC

## 2019-07-19 MED ORDER — DEXAMETHASONE SODIUM PHOSPHATE 10 MG/ML IJ SOLN
6.0000 mg | INTRAMUSCULAR | Status: DC
  Administered 2019-07-19: 6 mg via INTRAVENOUS
  Filled 2019-07-19: qty 1

## 2019-07-19 MED ORDER — SODIUM CHLORIDE 0.9 % IV BOLUS
500.0000 mL | Freq: Once | INTRAVENOUS | Status: AC
  Administered 2019-07-19: 500 mL via INTRAVENOUS

## 2019-07-19 MED ORDER — FINASTERIDE 5 MG PO TABS
5.0000 mg | ORAL_TABLET | Freq: Every morning | ORAL | Status: DC
  Administered 2019-07-20 – 2019-07-29 (×10): 5 mg via ORAL
  Filled 2019-07-19 (×10): qty 1

## 2019-07-19 MED ORDER — INSULIN ASPART 100 UNIT/ML ~~LOC~~ SOLN
0.0000 [IU] | Freq: Three times a day (TID) | SUBCUTANEOUS | Status: DC
  Administered 2019-07-20: 3 [IU] via SUBCUTANEOUS

## 2019-07-19 MED ORDER — TAMSULOSIN HCL 0.4 MG PO CAPS
0.4000 mg | ORAL_CAPSULE | Freq: Every day | ORAL | Status: DC
  Administered 2019-07-20 – 2019-07-29 (×10): 0.4 mg via ORAL
  Filled 2019-07-19 (×10): qty 1

## 2019-07-19 MED ORDER — ONDANSETRON HCL 4 MG/2ML IJ SOLN: 4.0000 mg | Freq: Four times a day (QID) | INTRAMUSCULAR | Status: DC | PRN

## 2019-07-19 MED ORDER — PRAVASTATIN SODIUM 10 MG PO TABS
10.0000 mg | ORAL_TABLET | Freq: Every day | ORAL | Status: DC
  Administered 2019-07-20 – 2019-07-28 (×10): 10 mg via ORAL
  Filled 2019-07-19 (×10): qty 1

## 2019-07-19 MED ORDER — ALBUTEROL SULFATE HFA 108 (90 BASE) MCG/ACT IN AERS
1.0000 | INHALATION_SPRAY | Freq: Four times a day (QID) | RESPIRATORY_TRACT | Status: DC | PRN
  Administered 2019-07-20: 1 via RESPIRATORY_TRACT
  Filled 2019-07-19: qty 6.7

## 2019-07-19 MED ORDER — DM-GUAIFENESIN ER 30-600 MG PO TB12
1.0000 | ORAL_TABLET | Freq: Two times a day (BID) | ORAL | Status: DC
  Administered 2019-07-20 – 2019-07-29 (×20): 1 via ORAL
  Filled 2019-07-19 (×20): qty 1

## 2019-07-19 MED ORDER — CANAGLIFLOZIN 100 MG PO TABS
100.0000 mg | ORAL_TABLET | Freq: Every day | ORAL | Status: DC
  Administered 2019-07-20 – 2019-07-29 (×10): 100 mg via ORAL
  Filled 2019-07-19 (×11): qty 1

## 2019-07-19 MED ORDER — ASPIRIN EC 81 MG PO TBEC
81.0000 mg | DELAYED_RELEASE_TABLET | Freq: Every morning | ORAL | Status: DC
  Administered 2019-07-20 – 2019-07-29 (×10): 81 mg via ORAL
  Filled 2019-07-19 (×10): qty 1

## 2019-07-19 MED ORDER — ONDANSETRON HCL 4 MG PO TABS: 4.0000 mg | ORAL_TABLET | Freq: Four times a day (QID) | ORAL | Status: DC | PRN

## 2019-07-19 MED ORDER — TIMOLOL MALEATE 0.5 % OP SOLN
1.0000 [drp] | Freq: Two times a day (BID) | OPHTHALMIC | Status: DC
  Administered 2019-07-20 – 2019-07-29 (×19): 1 [drp] via OPHTHALMIC
  Filled 2019-07-19: qty 5

## 2019-07-19 MED ORDER — ACETAMINOPHEN 325 MG PO TABS
650.0000 mg | ORAL_TABLET | Freq: Four times a day (QID) | ORAL | Status: DC | PRN
  Administered 2019-07-21 – 2019-07-25 (×4): 650 mg via ORAL
  Filled 2019-07-19 (×3): qty 2

## 2019-07-19 MED ORDER — POLYETHYLENE GLYCOL 3350 17 G PO PACK
17.0000 g | PACK | Freq: Every day | ORAL | Status: DC | PRN
  Administered 2019-07-21 – 2019-07-22 (×2): 17 g via ORAL
  Filled 2019-07-19 (×2): qty 1

## 2019-07-19 MED ORDER — INSULIN ASPART PROT & ASPART (70-30 MIX) 100 UNIT/ML ~~LOC~~ SUSP
30.0000 [IU] | Freq: Every day | SUBCUTANEOUS | Status: DC
  Administered 2019-07-20 – 2019-07-23 (×4): 30 [IU] via SUBCUTANEOUS
  Filled 2019-07-19: qty 10

## 2019-07-19 NOTE — ED Notes (Signed)
Report given to [facility] 

## 2019-07-19 NOTE — ED Triage Notes (Signed)
Pt brought in by wife for cough, sob, fever, and weakness. Tested positive for covid on Friday. No meds taken for fever.

## 2019-07-19 NOTE — ED Notes (Signed)
Patient's oxygen was reduced from 2L to RA for ten minutes with a drop in oxygen saturations from 100 to 91.
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
## 2019-07-19 NOTE — ED Notes (Signed)
Cynthia(pt wife) called and gave her new cell phone number [phone] and stated she and their daughter are the only people that can get information about pt. Pt daughter Vida Roller number: [phone])

## 2019-07-19 NOTE — ED Notes (Signed)
Pt placed on 2L [place] with improvement in sats to 97%.
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
## 2019-07-20 ENCOUNTER — Other Ambulatory Visit: Payer: Self-pay

## 2019-07-20 ENCOUNTER — Encounter (HOSPITAL_COMMUNITY): Payer: Self-pay

## 2019-07-20 DIAGNOSIS — R0603 Acute respiratory distress: Secondary | ICD-10-CM

## 2019-07-20 LAB — HEMOGLOBIN A1C
Hgb A1c MFr Bld: 7.3 % — ABNORMAL HIGH (ref 4.8–5.6)
Mean Plasma Glucose: 162.81 mg/dL

## 2019-07-20 LAB — ABO/RH
ABO/RH(D): O POS
ABO/RH(D): O POS

## 2019-07-20 LAB — GLUCOSE, CAPILLARY
Glucose-Capillary: 232 mg/dL — ABNORMAL HIGH (ref 70–99)
Glucose-Capillary: 228 mg/dL — ABNORMAL HIGH (ref 70–99)
Glucose-Capillary: 261 mg/dL — ABNORMAL HIGH (ref 70–99)
Glucose-Capillary: 219 mg/dL — ABNORMAL HIGH (ref 70–99)

## 2019-07-20 LAB — TYPE AND SCREEN
ABO/RH(D): O POS
Antibody Screen: NEGATIVE

## 2019-07-20 LAB — C-REACTIVE PROTEIN: CRP: 14.1 mg/dL — ABNORMAL HIGH (ref <1.0)

## 2019-07-20 LAB — HIV ANTIBODY (ROUTINE TESTING W REFLEX): HIV Screen 4th Generation wRfx: NONREACTIVE

## 2019-07-20 MED ORDER — AMLODIPINE BESYLATE 10 MG PO TABS
10.0000 mg | ORAL_TABLET | Freq: Every day | ORAL | Status: DC
  Administered 2019-07-21 – 2019-07-29 (×9): 10 mg via ORAL
  Filled 2019-07-20: qty 2
  Filled 2019-07-20: qty 1
  Filled 2019-07-20 (×7): qty 2
  Filled 2019-07-20: qty 1

## 2019-07-20 MED ORDER — INSULIN ASPART 100 UNIT/ML ~~LOC~~ SOLN
0.0000 [IU] | Freq: Three times a day (TID) | SUBCUTANEOUS | Status: DC
  Administered 2019-07-20: 8 [IU] via SUBCUTANEOUS
  Administered 2019-07-20: 5 [IU] via SUBCUTANEOUS
  Administered 2019-07-21: 3 [IU] via SUBCUTANEOUS
  Administered 2019-07-21: 8 [IU] via SUBCUTANEOUS
  Administered 2019-07-21 – 2019-07-22 (×2): 3 [IU] via SUBCUTANEOUS
  Administered 2019-07-22: 8 [IU] via SUBCUTANEOUS
  Administered 2019-07-22: 3 [IU] via SUBCUTANEOUS
  Administered 2019-07-23: 5 [IU] via SUBCUTANEOUS
  Administered 2019-07-23: 3 [IU] via SUBCUTANEOUS
  Administered 2019-07-23 – 2019-07-24 (×2): 5 [IU] via SUBCUTANEOUS
  Administered 2019-07-24: 3 [IU] via SUBCUTANEOUS
  Administered 2019-07-24: 5 [IU] via SUBCUTANEOUS
  Administered 2019-07-25 (×2): 3 [IU] via SUBCUTANEOUS
  Administered 2019-07-25: 5 [IU] via SUBCUTANEOUS
  Administered 2019-07-26: 8 [IU] via SUBCUTANEOUS
  Administered 2019-07-26: 11 [IU] via SUBCUTANEOUS
  Administered 2019-07-26: 2 [IU] via SUBCUTANEOUS
  Administered 2019-07-27: 3 [IU] via SUBCUTANEOUS
  Administered 2019-07-27: 5 [IU] via SUBCUTANEOUS
  Administered 2019-07-28: 15 [IU] via SUBCUTANEOUS
  Administered 2019-07-28: 3 [IU] via SUBCUTANEOUS
  Administered 2019-07-28: 11 [IU] via SUBCUTANEOUS
  Administered 2019-07-29: 2 [IU] via SUBCUTANEOUS

## 2019-07-20 MED ORDER — AMLODIPINE BESYLATE 5 MG PO TABS: 10.0000 mg | ORAL_TABLET | Freq: Every day | ORAL | Status: DC

## 2019-07-20 MED ORDER — HYDROCHLOROTHIAZIDE 25 MG PO TABS
25.0000 mg | ORAL_TABLET | Freq: Two times a day (BID) | ORAL | Status: DC
  Administered 2019-07-20 – 2019-07-29 (×19): 25 mg via ORAL
  Filled 2019-07-20 (×20): qty 1

## 2019-07-20 MED ORDER — LISINOPRIL 20 MG PO TABS
20.0000 mg | ORAL_TABLET | Freq: Two times a day (BID) | ORAL | Status: DC
  Administered 2019-07-20 – 2019-07-29 (×19): 20 mg via ORAL
  Filled 2019-07-20: qty 1
  Filled 2019-07-20 (×4): qty 2
  Filled 2019-07-20: qty 1
  Filled 2019-07-20: qty 2
  Filled 2019-07-20 (×2): qty 1
  Filled 2019-07-20 (×10): qty 2

## 2019-07-20 MED ORDER — INSULIN ASPART 100 UNIT/ML ~~LOC~~ SOLN
0.0000 [IU] | Freq: Every day | SUBCUTANEOUS | Status: DC
  Administered 2019-07-20: 2 [IU] via SUBCUTANEOUS
  Administered 2019-07-21: 3 [IU] via SUBCUTANEOUS
  Administered 2019-07-22 – 2019-07-23 (×2): 2 [IU] via SUBCUTANEOUS
  Administered 2019-07-25 – 2019-07-26 (×2): 3 [IU] via SUBCUTANEOUS

## 2019-07-20 MED ORDER — SODIUM CHLORIDE 0.9 % IV SOLN
100.0000 mg | Freq: Every day | INTRAVENOUS | Status: DC
  Administered 2019-07-20: 100 mg via INTRAVENOUS
  Filled 2019-07-20: qty 100

## 2019-07-20 MED ORDER — SODIUM CHLORIDE 0.9 % IV SOLN
100.0000 mg | Freq: Every day | INTRAVENOUS | Status: AC
  Administered 2019-07-21 – 2019-07-23 (×3): 100 mg via INTRAVENOUS
  Filled 2019-07-20 (×3): qty 100

## 2019-07-20 MED ORDER — INSULIN ASPART PROT & ASPART (70-30 MIX) 100 UNIT/ML ~~LOC~~ SUSP
20.0000 [IU] | Freq: Every day | SUBCUTANEOUS | Status: DC
  Administered 2019-07-21 – 2019-07-28 (×8): 20 [IU] via SUBCUTANEOUS
  Filled 2019-07-20: qty 10

## 2019-07-20 MED ORDER — DEXAMETHASONE SODIUM PHOSPHATE 10 MG/ML IJ SOLN
6.0000 mg | Freq: Two times a day (BID) | INTRAMUSCULAR | Status: DC
  Administered 2019-07-20 – 2019-07-21 (×3): 6 mg via INTRAVENOUS
  Filled 2019-07-20 (×3): qty 1

## 2019-07-20 MED ORDER — AMLODIPINE BESYLATE 5 MG PO TABS
5.0000 mg | ORAL_TABLET | Freq: Once | ORAL | Status: AC
  Administered 2019-07-20: 5 mg via ORAL
  Filled 2019-07-20: qty 1

## 2019-07-20 MED ORDER — INSULIN ASPART 100 UNIT/ML ~~LOC~~ SOLN
4.0000 [IU] | Freq: Three times a day (TID) | SUBCUTANEOUS | Status: DC
  Administered 2019-07-20 – 2019-07-23 (×10): 4 [IU] via SUBCUTANEOUS

## 2019-07-20 NOTE — Progress Notes (Signed)
MEWS Guidelines - (patients age 69 and over)  Red - At High Risk for Deterioration Yellow - At risk for Deterioration  1. Go to room and assess patient 2. Validate data. Is this patient's baseline? If data confirmed: 3. Is this an acute change? 4. Administer prn meds/treatments as ordered. 5. Note Sepsis score 6. Review goals of care 7. Sports coach, RRT nurse and Provider. 8. Ask Provider to come to bedside.  9. Document patient condition/interventions/response. 10. Increase frequency of vital signs and focused assessments to at least q15 minutes x 4, then q30 minutes x2. - If stable, then q1h x3, then q4h x3 and then q8h or dept. routine. - If unstable, contact Provider & RRT nurse. Prepare for possible transfer. 11. Add entry in progress notes using the smart phrase ".MEWS". 1. Go to room and assess patient 2. Validate data. Is this patient's baseline? If data confirmed: 3. Is this an acute change? 4. Administer prn meds/treatments as ordered? 5. Note Sepsis score 6. Review goals of care 7. Sports coach and Provider 8. Call RRT nurse as needed. 9. Document patient condition/interventions/response. 10. Increase frequency of vital signs and focused assessments to at least q2h x2. - If stable, then q4h x2 and then q8h or dept. routine. - If unstable, contact Provider & RRT nurse. Prepare for possible transfer. 11. Add entry in progress notes using the smart phrase ".MEWS".  Green - Likely stable Lavender - Comfort Care Only  1. Continue routine/ordered monitoring.  2. Review goals of care. 1. Continue routine/ordered monitoring. 2. Review goals of care.   Patient is in the yellow, which is where he was before arrival.  This is not an acute change from my shift.  Patient is in no acute distress.  He remains tachypnic, which is why his MEWS is a 2.  Lung sounds are diminished throughout with no rales, rhonchi, or wheezing noted.  Patient able to eat dinner without  increasing oxygen requirements.
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
## 2019-07-20 NOTE — Plan of Care (Signed)
  Problem: Education: Goal: Knowledge of General Education information will improve Description: Including pain rating scale, medication(s)/side effects and non-pharmacologic comfort measures Outcome: Progressing   Problem: Safety: Goal: Ability to remain free from injury will improve Outcome: Progressing   

## 2019-07-20 NOTE — Evaluation (Signed)
Physical Therapy Evaluation Patient Details Name: Randy Lee MRN: [id number] DOB: December 12, 1950 Today's Date: 07/20/2019   History of Present Illness  68 y/o man w/ hx of pruritic disorder, HTN, DM II, hepatitis, esophageal reflux, dyspnea, chets pain, cancer, blindness, acute pharyngitis. cervial spine surgery, liver biopsy, wound debrodement, cornea transplant x 2. pt presented to ED w/ SOB was dx with COVID 11/27  Clinical Impression   Pt admitted with above diagnosis. Pt is very reluctant to answer hx question, when asked. He states that he ans his spouse live in an an assisted living facility and that he usually gets around using a a cane. Pt currently with functional limitations due to the deficits listed below (see PT Problem List). Pt is needing mod a with all functional mobility this am, verbal and tactile cues also needed secondary to hx of blindness. Did not attempt ambulation further than bed as pt does not have his walking cane with him. Pt was on 1L/min via [place] and able to maintain sats >90, did note aftre mobility had increase incidence of coughing. Pt will benefit from skilled PT to increase his overall strength, balance and coordination, activity tolerance, independence and safety with mobility to allow discharge to the venue listed below.       Follow Up Recommendations SNF    Equipment Recommendations  None recommended by PT    Recommendations for Other Services OT consult     Precautions / Restrictions Precautions Precautions: Fall;Other (comment)(blindness) Restrictions Weight Bearing Restrictions: No      Mobility  Bed Mobility Overal bed mobility: Needs Assistance Bed Mobility: Supine to Sit;Sit to Supine     Supine to sit: Mod assist Sit to supine: Mod assist   General bed mobility comments: needs set up and verbal cues sec to weakness and also blindness  Transfers Overall transfer level: Needs assistance Equipment used: 1 person hand held  assist Transfers: Sit to/from [employer] Transfers Sit to Stand: Mod assist Stand pivot transfers: Mod assist       General transfer comment: did not have cane here for mobility, able to complete transfer with HHA  Ambulation/Gait                Stairs            Wheelchair Mobility    Modified Rankin (Stroke Patients Only)       Balance Overall balance assessment: Mild deficits observed, not formally tested                                           Pertinent Vitals/Pain Pain Assessment: Faces Faces Pain Scale: Hurts little more Pain Location: w/ movement pain at foley site Pain Intervention(s): Limited activity within patient's tolerance    Home Living Family/patient expects to be discharged to:: Assisted living Living Arrangements: Other (Comment)(states lives in same facility as spouse but not quite togeth)             Home Equipment: Kasandra Knudsen - single point;Bedside commode;Wheelchair - manual      Prior Function Level of Independence: Needs assistance   Gait / Transfers Assistance Needed: states able to complete with cane  ADL's / Homemaking Assistance Needed: needs assistance with this        Hand Dominance        Extremity/Trunk Assessment   Upper Extremity Assessment Upper Extremity Assessment: Defer to OT  evaluation    Lower Extremity Assessment Lower Extremity Assessment: Generalized weakness    Cervical / Trunk Assessment Cervical / Trunk Assessment: Normal  Communication   Communication: No difficulties  Cognition Arousal/Alertness: Lethargic Behavior During Therapy: Flat affect Overall Cognitive Status: No family/caregiver present to determine baseline cognitive functioning                                        General Comments      Exercises     Assessment/Plan    PT Assessment Patient needs continued PT services  PT Problem List Decreased strength;Decreased activity  tolerance;Decreased balance;Decreased mobility;Decreased coordination;Decreased safety awareness       PT Treatment Interventions Gait training;DME instruction;Functional mobility training;Therapeutic activities;Therapeutic exercise;Balance training;Neuromuscular re-education    PT Goals (Current goals can be found in the Care Plan section)  Acute Rehab PT Goals Patient Stated Goal: to go home PT Goal Formulation: With patient Time For Goal Achievement: 08/03/19 Potential to Achieve Goals: Fair    Frequency Min 2X/week   Barriers to discharge        Co-evaluation               AM-PAC PT "6 Clicks" Mobility  Outcome Measure Help needed turning from your back to your side while in a flat bed without using bedrails?: A Lot Help needed moving from lying on your back to sitting on the side of a flat bed without using bedrails?: A Lot Help needed moving to and from a bed to a chair (including a wheelchair)?: A Lot Help needed standing up from a chair using your arms (e.g., wheelchair or bedside chair)?: A Lot Help needed to walk in hospital room?: A Lot Help needed climbing 3-5 steps with a railing? : A Lot 6 Click Score: 12    End of Session   Activity Tolerance: Patient tolerated treatment well Patient left: in chair;with call bell/phone within reach Nurse Communication: Mobility status PT Visit Diagnosis: Other abnormalities of gait and mobility (R26.89);Muscle weakness (generalized) (M62.81)    Time: [id number] PT Time Calculation (min) (ACUTE ONLY): 18 min   Charges:   PT Evaluation $PT Eval Moderate Complexity: [place], PT   Delford Field 07/20/2019, 12:14 PM

## 2019-07-20 NOTE — Progress Notes (Signed)
Patient received to [facility] room 9176.  Patient oxygen requirements are 2L with 98-100% oxygen saturation.  Patient is tachypnic, but no distress noted.  Condom catheter placed d/t urinary incontinence.  Bath given.  Patient placed on monitor.

## 2019-07-21 LAB — COMPREHENSIVE METABOLIC PANEL
ALT: 35 U/L (ref 0–44)
AST: 32 U/L (ref 15–41)
Albumin: 3.1 g/dL — ABNORMAL LOW (ref 3.5–5.0)
Alkaline Phosphatase: 41 U/L (ref 38–126)
Anion gap: 15 (ref 5–15)
BUN: 30 mg/dL — ABNORMAL HIGH (ref 8–23)
CO2: 25 mmol/L (ref 22–32)
Calcium: 9.9 mg/dL (ref 8.9–10.3)
Chloride: 96 mmol/L — ABNORMAL LOW (ref 98–111)
Creatinine, Ser: 1.03 mg/dL (ref 0.61–1.24)
GFR calc Af Amer: >60 mL/min (ref >60)
GFR calc non Af Amer: >60 mL/min (ref >60)
Glucose, Bld: 142 mg/dL — ABNORMAL HIGH (ref 70–99)
Potassium: 4.5 mmol/L (ref 3.5–5.1)
Sodium: 136 mmol/L (ref 135–145)
Total Bilirubin: 0.6 mg/dL (ref 0.3–1.2)
Total Protein: 7.5 g/dL (ref 6.5–8.1)

## 2019-07-21 LAB — CBC WITH DIFFERENTIAL/PLATELET
Abs Immature Granulocytes: 0.05 10*3/uL (ref 0.00–0.07)
Basophils Absolute: 0 10*3/uL (ref 0.0–0.1)
Basophils Relative: 0 %
Eosinophils Absolute: 0 10*3/uL (ref 0.0–0.5)
Eosinophils Relative: 0 %
HCT: 40.9 % (ref 39.0–52.0)
Hemoglobin: 13.7 g/dL (ref 13.0–17.0)
Immature Granulocytes: 1 %
Lymphocytes Relative: 11 %
Lymphs Abs: 0.8 10*3/uL (ref 0.7–4.0)
MCH: 30 pg (ref 26.0–34.0)
MCHC: 33.5 g/dL (ref 30.0–36.0)
MCV: 89.7 fL (ref 80.0–100.0)
Monocytes Absolute: 0.3 10*3/uL (ref 0.1–1.0)
Monocytes Relative: 4 %
Neutro Abs: 6.3 10*3/uL (ref 1.7–7.7)
Neutrophils Relative %: 84 %
Platelets: 220 10*3/uL (ref 150–400)
RBC: 4.56 MIL/uL (ref 4.22–5.81)
RDW: 12.4 % (ref 11.5–15.5)
WBC: 7.4 10*3/uL (ref 4.0–10.5)
nRBC: 0 % (ref 0.0–0.2)

## 2019-07-21 LAB — GLUCOSE, CAPILLARY
Glucose-Capillary: 185 mg/dL — ABNORMAL HIGH (ref 70–99)
Glucose-Capillary: 194 mg/dL — ABNORMAL HIGH (ref 70–99)
Glucose-Capillary: 268 mg/dL — ABNORMAL HIGH (ref 70–99)
Glucose-Capillary: 266 mg/dL — ABNORMAL HIGH (ref 70–99)

## 2019-07-21 LAB — C-REACTIVE PROTEIN: CRP: 12.1 mg/dL — ABNORMAL HIGH (ref <1.0)

## 2019-07-21 LAB — BRAIN NATRIURETIC PEPTIDE: B Natriuretic Peptide: 118.9 pg/mL — ABNORMAL HIGH (ref 0.0–100.0)

## 2019-07-21 LAB — D-DIMER, QUANTITATIVE: D-Dimer, Quant: 0.89 ug/mL-FEU — ABNORMAL HIGH (ref 0.00–0.50)

## 2019-07-21 LAB — MAGNESIUM: Magnesium: 1.9 mg/dL (ref 1.7–2.4)

## 2019-07-21 MED ORDER — GUAIFENESIN-DM 100-10 MG/5ML PO SYRP
5.0000 mL | ORAL_SOLUTION | ORAL | Status: DC | PRN
  Administered 2019-07-21 – 2019-07-22 (×3): 5 mL via ORAL
  Filled 2019-07-21 (×3): qty 5

## 2019-07-21 MED ORDER — DEXAMETHASONE SODIUM PHOSPHATE 10 MG/ML IJ SOLN
6.0000 mg | INTRAMUSCULAR | Status: AC
  Administered 2019-07-22 – 2019-07-28 (×7): 6 mg via INTRAVENOUS
  Filled 2019-07-21 (×7): qty 1

## 2019-07-21 NOTE — Progress Notes (Addendum)
0745: Assumed care of Pt from night RN. Pt alert, C/o some "discomfort" in his abdomen, HR SB, SpO2 95% on 2 L [place]. Denies needs at this time, instructed on how to use call bell, will monitor.  1030: Called spouse to give updates with no answer. Left message  1036: Discussed Pt with spouse, all questions answered.   1200: Pt resting comfortably in the chair. Gave miralax as Pt tried and did not go this morning and having a little abdominal discomfort. OT at bedside for therapy, will monitor  1545: Reassessed, denies needs at this time. EKG completed per order and placed in chart. Call bell within reach  1900: Assisted Pt back to bed. No other needs at this time. Call bell within reach

## 2019-07-21 NOTE — TOC Initial Note (Signed)
Transition of Care [facility]) - Initial/Assessment Note    Patient Details  Name: Randy Lee MRN: [id number] Date of Birth: 1950/08/24  [facility] of [facility]) CM/SW Contact:    Amador Cunas, [place] Phone Number: 07/21/2019, 2:15 PM  Clinical Narrative: Spoke to pt, pt's wife, and pt's dtr re PT recommendation for SNF. Pt admitted from senior living apartments in [place], [place] where he and his wife reside in separate apartments. Pt prefers to return home but agreeable to SNF if remains with current level of weakness. Will initiate SNF search and f/u with offers once available.                 Expected Discharge Plan: Skilled Nursing Facility Barriers to Discharge: Continued Medical Work up   Patient Goals and CMS Choice        Expected Discharge Plan and Services Expected Discharge Plan: [place]       Living arrangements for the past 2 months: Apartment(Senior housing)                                      Prior Living Arrangements/Services Living arrangements for the past 2 months: Apartment(Senior housing) Lives with:: Self(wife lives in [place] apartment, same complex) Patient language and need for interpreter reviewed:: No        Need for Family Participation in Patient Care: No (Comment)     Criminal Activity/Legal Involvement Pertinent to Current Situation/Hospitalization: No - Comment as needed  Activities of Daily Living Home Assistive Devices/Equipment: Blood pressure cuff, CBG Meter ADL Screening (condition at time of admission) Patient's cognitive ability adequate to safely complete daily activities?: Yes Is the patient deaf or have difficulty hearing?: No Does the patient have difficulty seeing, even when wearing glasses/contacts?: Yes Does the patient have difficulty concentrating, remembering, or making decisions?: No Patient able to express need for assistance with ADLs?: Yes Does the patient have difficulty dressing  or bathing?: Yes Independently performs ADLs?: No Communication: Independent Dressing (OT): Needs assistance Is this a change from baseline?: Pre-admission baseline Grooming: Needs assistance Is this a change from baseline?: Pre-admission baseline Feeding: Needs assistance Is this a change from baseline?: Pre-admission baseline Bathing: Needs assistance Is this a change from baseline?: Pre-admission baseline Toileting: Independent In/Out Bed: Needs assistance Is this a change from baseline?: Pre-admission baseline Walks in Home: Needs assistance Is this a change from baseline?: Pre-admission baseline Does the patient have difficulty walking or climbing stairs?: Yes Weakness of Legs: None Weakness of Arms/Hands: None  Permission Sought/Granted Permission sought to share information with : Chartered certified accountant granted to share information with : Yes, Verbal Permission Granted              Emotional Assessment       Orientation: : Oriented to Self, Oriented to Place, Fluctuating Orientation (Suspected and/or reported Sundowners), Oriented to Situation, Oriented to  Time   Psych Involvement: No (comment)  Admission diagnosis:  Respiratory distress [R06.03] COVID-19 [U07.1] Pneumonia due to COVID-19 virus [U07.1, J12.89] Patient Active Problem List   Diagnosis Date Noted  . Pneumonia due to COVID-19 virus 07/19/2019   PCP:  Monico Blitz, MD Pharmacy:   [place], [place] [id number] [place] [id number] Phone: [phone] Fax: [phone]     Social Determinants of Health (SDOH) Interventions    Readmission Risk Interventions No  flowsheet data found.

## 2019-07-21 NOTE — NC FL2 (Signed)
[place] LEVEL OF CARE SCREENING TOOL     IDENTIFICATION  Patient Name: Randy Lee Birthdate: [birth year] Sex: male Admission Date (Current Location): 07/19/2019  [facility] and [place] Number:  Herbalist and Address:  [facility], [place] [address] [place], [place] [address]([employer])      Provider Number: [id number]  Attending Physician Name and Address:  Elodia Florence., *  Relative Name and Phone Number:       Current Level of Care: Hospital Recommended Level of Care: [place] Prior Approval Number:    Date Approved/Denied:   PASRR Number:    Discharge Plan: SNF    Current Diagnoses: Patient Active Problem List   Diagnosis Date Noted  . Pneumonia due to COVID-19 virus 07/19/2019    Orientation RESPIRATION BLADDER Height & Weight     Self, Time, Situation, Place  O2(1L [place]) Continent Weight: 180 lb 14.4 oz (82.1 kg) Height:  5\' 9"  (175.3 cm)  BEHAVIORAL SYMPTOMS/MOOD NEUROLOGICAL BOWEL NUTRITION STATUS      Continent    AMBULATORY STATUS COMMUNICATION OF NEEDS Skin   Limited Assist Verbally Normal                       Personal Care Assistance Level of Assistance  Bathing, Dressing Bathing Assistance: Limited assistance   Dressing Assistance: Limited assistance     Functional Limitations Info             SPECIAL CARE FACTORS FREQUENCY  PT (By licensed PT), OT (By licensed OT)                    Contractures Contractures Info: Not present    Additional Factors Info  Code Status Code Status Info: FULL CODE             Current Medications (07/21/2019):  This is the current hospital active medication list Current Facility-Administered Medications  Medication Dose Route Frequency Provider Last Rate Last Dose  . acetaminophen (TYLENOL) tablet 650 mg  650 mg Oral Q6H PRN Emokpae, Ejiroghene E, MD   650 mg at 07/21/19 0829  . albuterol (VENTOLIN HFA)  108 (90 Base) MCG/ACT inhaler 1-2 puff  1-2 puff Inhalation Q6H PRN Emokpae, Ejiroghene E, MD   1 puff at 07/20/19 0933  . amLODipine (NORVASC) tablet 10 mg  10 mg Oral Daily Thurnell Lose, MD   10 mg at 07/21/19 0825  . aspirin EC tablet 81 mg  81 mg Oral q morning - 10a Emokpae, Ejiroghene E, MD   81 mg at 07/21/19 0825  . canagliflozin (INVOKANA) tablet 100 mg  100 mg Oral QAC breakfast Emokpae, Ejiroghene E, MD   100 mg at 07/21/19 0824  . [START ON 07/22/2019] dexamethasone (DECADRON) injection 6 mg  6 mg Intravenous Q24H Elodia Florence., MD      . dextromethorphan-guaiFENesin [facility] DM) 30-600 MG per 12 hr tablet 1 tablet  1 tablet Oral BID Emokpae, Ejiroghene E, MD   1 tablet at 07/21/19 0825  . enoxaparin (LOVENOX) injection 40 mg  40 mg Subcutaneous Q24H Emokpae, Ejiroghene E, MD   40 mg at 07/20/19 2323  . finasteride (PROSCAR) tablet 5 mg  5 mg Oral q morning - 10a Emokpae, Ejiroghene E, MD   5 mg at 07/21/19 0824  . lisinopril (ZESTRIL) tablet 20 mg  20 mg Oral BID Emokpae, Ejiroghene E, MD   20 mg  at 07/21/19 0826   And  . hydrochlorothiazide (HYDRODIURIL) tablet 25 mg  25 mg Oral BID Emokpae, Ejiroghene E, MD   25 mg at 07/21/19 0824  . insulin aspart (novoLOG) injection 0-15 Units  0-15 Units Subcutaneous TID WC Thurnell Lose, MD   3 Units at 07/21/19 1200  . insulin aspart (novoLOG) injection 0-5 Units  0-5 Units Subcutaneous QHS Thurnell Lose, MD   2 Units at 07/20/19 2121  . insulin aspart (novoLOG) injection 4 Units  4 Units Subcutaneous TID WC Thurnell Lose, MD   4 Units at 07/21/19 1200  . insulin aspart protamine- aspart (NOVOLOG MIX 70/30) injection 20 Units  20 Units Subcutaneous QAC supper Emokpae, Ejiroghene E, MD      . insulin aspart protamine- aspart (NOVOLOG MIX 70/30) injection 30 Units  30 Units Subcutaneous QAC breakfast Emokpae, Ejiroghene E, MD   30 Units at 07/21/19 0828  . ondansetron (ZOFRAN) tablet 4 mg  4 mg Oral Q6H PRN Emokpae,  Ejiroghene E, MD       Or  . ondansetron (ZOFRAN) injection 4 mg  4 mg Intravenous Q6H PRN Emokpae, Ejiroghene E, MD      . oxybutynin (DITROPAN) tablet 5 mg  5 mg Oral QPM Emokpae, Ejiroghene E, MD   5 mg at 07/20/19 1804  . polyethylene glycol (MIRALAX / GLYCOLAX) packet 17 g  17 g Oral Daily PRN Emokpae, Ejiroghene E, MD   17 g at 07/21/19 1159  . pravastatin (PRAVACHOL) tablet 10 mg  10 mg Oral QHS Emokpae, Ejiroghene E, MD   10 mg at 07/20/19 2122  . prednisoLONE acetate (PRED FORTE) 1 % ophthalmic suspension 1 drop  1 drop Right Eye BID Emokpae, Ejiroghene E, MD   1 drop at 07/21/19 0821  . remdesivir 100 mg in sodium chloride 0.9 % 250 mL IVPB  100 mg Intravenous Daily Emiliano Dyer, RPH 500 mL/hr at 07/21/19 0837 100 mg at 07/21/19 0837  . sodium chloride flush (NS) 0.9 % injection 3 mL  3 mL Intravenous Q12H Emokpae, Ejiroghene E, MD   3 mL at 07/21/19 0826  . tamsulosin (FLOMAX) capsule 0.4 mg  0.4 mg Oral Daily Emokpae, Ejiroghene E, MD   0.4 mg at 07/21/19 0825  . timolol (TIMOPTIC) 0.5 % ophthalmic solution 1 drop  1 drop Right Eye BID Emokpae, Ejiroghene E, MD   1 drop at 07/21/19 [id number]     Discharge Medications: Please see discharge summary for a list of discharge medications.  Relevant Imaging Results:  Relevant Lab Results:   Additional Information SS# [id number]  Amador Cunas, [place]

## 2019-07-21 NOTE — Progress Notes (Signed)
Occupational Therapy Evaluation Patient Details Name: Randy Lee MRN: [id number] DOB: [birth year] Today's Date: 07/21/2019    History of Present Illness 68 y/o man w/ hx of pruritic disorder, HTN, DM II, hepatitis, esophageal reflux, dyspnea, chets pain, cancer, blindness, acute pharyngitis. cervial spine surgery, liver biopsy, wound debrodement, cornea transplant x 2. pt presented to ED w/ SOB was dx with COVID 11/27   Clinical Impression   PTA, pt modified independent with ADL and mobility with use of straight cane. Pt seen on RA with SpO2 desat briefly to 88 during mobility. Completed grooming standing at sink followed by ambulating 50 feet @ RW level with minguard A. Pt states his wife will be able to come over and assist at DC (unsure if this is accurate). Feel pt will most likely progress to DC home with [place] and [place] aide.  Issued flutter valve and incentive spirometer  - pt completed both x 10, able to pull @ 1000 on incentive spirometer. Encouraged pt to stand and ambulate with nsg staff.  Will follow acutely.     Follow Up Recommendations  Home health OT;Supervision/Assistance - 24 hour(initially)    Equipment Recommendations  Other (comment)(RW)    Recommendations for Other Services       Precautions / Restrictions Precautions Precautions: Fall;Other (comment)(blindness) Restrictions Weight Bearing Restrictions: No      Mobility Bed Mobility               General bed mobility comments: OOB in chair  Transfers Overall transfer level: Needs assistance Equipment used: Rolling walker (2 wheeled) Transfers: Sit to/from [employer] Transfers Sit to Stand: Supervision Stand pivot transfers: Min guard            Balance Overall balance assessment: Mild deficits observed, not formally tested                                         ADL either performed or assessed with clinical judgement   ADL Overall ADL's : Needs  assistance/impaired     Grooming: Min guard;Standing   Upper Body Bathing: Set up;Sitting   Lower Body Bathing: Min guard;Sit to/from stand   Upper Body Dressing : Set up;Sitting   Lower Body Dressing: Minimal assistance;Sit to/from stand   Toilet Transfer: Min guard;RW;BSC   Toileting- Water quality scientist and Hygiene: Set up;Sit to/from stand       Functional mobility during ADLs: Min guard;Rolling walker;Cueing for safety General ADL Comments: Stood at sink for grooming followed by ambulating @ room 50 ft with minguard A using RW     Vision Baseline Vision/History: Legally blind Patient Visual Report: (pt sees shades; lighting and contrast improves functional vi)       Perception     Praxis      Pertinent Vitals/Pain Pain Assessment: No/denies pain     Hand Dominance Right   Extremity/Trunk Assessment Upper Extremity Assessment Upper Extremity Assessment: Generalized weakness   Lower Extremity Assessment Lower Extremity Assessment: Defer to PT evaluation   Cervical / Trunk Assessment Cervical / Trunk Assessment: Normal   Communication Communication Communication: No difficulties   Cognition Arousal/Alertness: Awake/alert Behavior During Therapy: WFL for tasks assessed/performed Overall Cognitive Status: Within Functional Limits for tasks assessed  General Comments: endorses memory deficits at baseline   General Comments       Exercises Exercises: Other exercises Other Exercises Other Exercises: incentive spirometer x 10 - able to pull @ 1000 ml Other Exercises: flutter valve x 10  Other Exercises: encouraged sit - stand when nsg comes in the room   Shoulder Instructions      Home Living Family/patient expects to be discharged to:: Private residence(senior Living apt) Living Arrangements: Spouse/significant other Available Help at Discharge: Family;Available 24 hours/day Type of Home: Apartment              Bathroom Shower/Tub: Teacher, early years/pre: Handicapped height Bathroom Accessibility: Yes How Accessible: Accessible via walker Home Equipment: [place] - single point;Bedside commode;Wheelchair - manual;Grab bars - tub/shower;Grab bars - toilet   Additional Comments: Believe there is a emergency pull cord - need to clarify      Prior Functioning/Environment Level of Independence: Independent with assistive device(s)    ADL's / Homemaking Assistance Needed: states he is able to bath and dresshimself            OT Problem List: Decreased strength;Decreased activity tolerance;Decreased safety awareness;Decreased knowledge of use of DME or AE;Cardiopulmonary status limiting activity      OT Treatment/Interventions: Self-care/ADL training;Therapeutic exercise;Neuromuscular education;Energy conservation;DME and/or AE instruction;Therapeutic activities;Patient/family education    OT Goals(Current goals can be found in the care plan section) Acute Rehab OT Goals Patient Stated Goal: to go home OT Goal Formulation: With patient Time For Goal Achievement: 08/04/19 Potential to Achieve Goals: Good  OT Frequency: Min 3X/week   Barriers to D/C:            Co-evaluation              AM-PAC OT "6 Clicks" Daily Activity     Outcome Measure Help from another person eating meals?: None Help from another person taking care of personal grooming?: A Little Help from another person toileting, which includes using toliet, bedpan, or urinal?: A Little Help from another person bathing (including washing, rinsing, drying)?: A Little Help from another person to put on and taking off regular upper body clothing?: A Little Help from another person to put on and taking off regular lower body clothing?: A Little 6 Click Score: 19   End of Session Equipment Utilized During Treatment: Gait belt;Rolling walker Nurse Communication: Mobility status  Activity Tolerance:  Patient tolerated treatment well Patient left: in chair;with call bell/phone within reach  OT Visit Diagnosis: Unsteadiness on feet (R26.81);Muscle weakness (generalized) (M62.81)                Time: [id number] OT Time Calculation (min): 39 min Charges:  OT General Charges $OT Visit: 1 Visit OT Evaluation $OT Eval Moderate Complexity: 1 Mod OT Treatments $Self Care/Home Management : 8-22 mins $Therapeutic Activity: 8-22 mins  Maurie Boettcher, OT/L   Acute OT Clinical Specialist [place] Pager [phone] Office [phone]   [facility] 07/21/2019, 5:16 PM
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
## 2019-07-22 ENCOUNTER — Inpatient Hospital Stay (HOSPITAL_COMMUNITY): Payer: Medicare PPO

## 2019-07-22 LAB — COMPREHENSIVE METABOLIC PANEL
ALT: 32 U/L (ref 0–44)
AST: 22 U/L (ref 15–41)
Albumin: 3.1 g/dL — ABNORMAL LOW (ref 3.5–5.0)
Alkaline Phosphatase: 43 U/L (ref 38–126)
Anion gap: 12 (ref 5–15)
BUN: 44 mg/dL — ABNORMAL HIGH (ref 8–23)
CO2: 22 mmol/L (ref 22–32)
Calcium: 9.6 mg/dL (ref 8.9–10.3)
Chloride: 97 mmol/L — ABNORMAL LOW (ref 98–111)
Creatinine, Ser: 1.13 mg/dL (ref 0.61–1.24)
GFR calc Af Amer: >60 mL/min (ref >60)
GFR calc non Af Amer: >60 mL/min (ref >60)
Glucose, Bld: 165 mg/dL — ABNORMAL HIGH (ref 70–99)
Potassium: 3.4 mmol/L — ABNORMAL LOW (ref 3.5–5.1)
Sodium: 131 mmol/L — ABNORMAL LOW (ref 135–145)
Total Bilirubin: 0.8 mg/dL (ref 0.3–1.2)
Total Protein: 7.4 g/dL (ref 6.5–8.1)

## 2019-07-22 LAB — CBC WITH DIFFERENTIAL/PLATELET
Abs Immature Granulocytes: 0.08 10*3/uL — ABNORMAL HIGH (ref 0.00–0.07)
Basophils Absolute: 0 10*3/uL (ref 0.0–0.1)
Basophils Relative: 0 %
Eosinophils Absolute: 0 10*3/uL (ref 0.0–0.5)
Eosinophils Relative: 0 %
HCT: 41.7 % (ref 39.0–52.0)
Hemoglobin: 14.2 g/dL (ref 13.0–17.0)
Immature Granulocytes: 1 %
Lymphocytes Relative: 6 %
Lymphs Abs: 0.8 10*3/uL (ref 0.7–4.0)
MCH: 29.8 pg (ref 26.0–34.0)
MCHC: 34.1 g/dL (ref 30.0–36.0)
MCV: 87.6 fL (ref 80.0–100.0)
Monocytes Absolute: 0.6 10*3/uL (ref 0.1–1.0)
Monocytes Relative: 5 %
Neutro Abs: 11.3 10*3/uL — ABNORMAL HIGH (ref 1.7–7.7)
Neutrophils Relative %: 88 %
Platelets: 248 10*3/uL (ref 150–400)
RBC: 4.76 MIL/uL (ref 4.22–5.81)
RDW: 12.3 % (ref 11.5–15.5)
WBC: 12.8 10*3/uL — ABNORMAL HIGH (ref 4.0–10.5)
nRBC: 0 % (ref 0.0–0.2)

## 2019-07-22 LAB — GLUCOSE, CAPILLARY
Glucose-Capillary: 175 mg/dL — ABNORMAL HIGH (ref 70–99)
Glucose-Capillary: 188 mg/dL — ABNORMAL HIGH (ref 70–99)
Glucose-Capillary: 286 mg/dL — ABNORMAL HIGH (ref 70–99)
Glucose-Capillary: 224 mg/dL — ABNORMAL HIGH (ref 70–99)

## 2019-07-22 LAB — FERRITIN: Ferritin: 483 ng/mL — ABNORMAL HIGH (ref 24–336)

## 2019-07-22 LAB — BRAIN NATRIURETIC PEPTIDE: B Natriuretic Peptide: 43.8 pg/mL (ref 0.0–100.0)

## 2019-07-22 LAB — C-REACTIVE PROTEIN: CRP: 4.7 mg/dL — ABNORMAL HIGH (ref <1.0)

## 2019-07-22 LAB — MAGNESIUM: Magnesium: 2.1 mg/dL (ref 1.7–2.4)

## 2019-07-22 LAB — D-DIMER, QUANTITATIVE: D-Dimer, Quant: 0.68 ug/mL-FEU — ABNORMAL HIGH (ref 0.00–0.50)

## 2019-07-22 MED ORDER — POTASSIUM CHLORIDE CRYS ER 20 MEQ PO TBCR
40.0000 meq | EXTENDED_RELEASE_TABLET | Freq: Once | ORAL | Status: AC
  Administered 2019-07-22: 40 meq via ORAL
  Filled 2019-07-22: qty 2

## 2019-07-22 MED ORDER — POLYETHYLENE GLYCOL 3350 17 G PO PACK
17.0000 g | PACK | Freq: Two times a day (BID) | ORAL | Status: DC
  Administered 2019-07-22 – 2019-07-27 (×8): 17 g via ORAL
  Filled 2019-07-22 (×12): qty 1

## 2019-07-22 MED ORDER — GUAIFENESIN-CODEINE 100-10 MG/5ML PO SOLN
5.0000 mL | Freq: Four times a day (QID) | ORAL | Status: DC | PRN
  Administered 2019-07-22 – 2019-07-28 (×6): 5 mL via ORAL
  Filled 2019-07-22 (×6): qty 5

## 2019-07-22 NOTE — Progress Notes (Addendum)
0745: Assumed care of Pt from night RN. Pt alert, still c/o some "discomfort" in his abdomen and an irritating cough that is contributing to some chest pain, HR SB-NSR, SpO2 97% on 0.5 L [place]. Call bell within reach, will monitor.  1030: Pt wants to rest in bed for a little while longer, will get up this afternoon. Will monitor  1330: Spouse updated over phone, all questions answered. Pt resting comfortable in chair, no needs at this time  1545: Reassessed, denies pain or needs at this time. VSS, room air. Call bell within reach  1825: Pt resting comfortably in the chair, denies needs at this time, call bell within reach

## 2019-07-22 NOTE — Plan of Care (Signed)
No acute events during this shift. Pt now on RA with no s/s of SOB or complaints at this time. Pt VSS, call bell within reach of pt, encouraged to use call bell for assistance. Continue to monitor Problem: Education: Goal: Knowledge of General Education information will improve Description: Including pain rating scale, medication(s)/side effects and non-pharmacologic comfort measures Outcome: Progressing   Problem: Health Behavior/Discharge Planning: Goal: Ability to manage health-related needs will improve Outcome: Progressing   Problem: Clinical Measurements: Goal: Ability to maintain clinical measurements within normal limits will improve Outcome: Progressing Goal: Will remain free from infection Outcome: Progressing Goal: Diagnostic test results will improve Outcome: Progressing Goal: Respiratory complications will improve Outcome: Progressing Goal: Cardiovascular complication will be avoided Outcome: Progressing   Problem: Activity: Goal: Risk for activity intolerance will decrease Outcome: Progressing   Problem: Nutrition: Goal: Adequate nutrition will be maintained Outcome: Progressing   Problem: Coping Goal: Level of anxiety will decrease Outcome: Progressing   Problem: Elimination: Goal: Will not experience complications related to bowel motility Outcome: Progressing Goal: Will not experience complications related to urinary retention Outcome: Progressing   Problem: Pain Managment: Goal: General experience of comfort will improve Outcome: Progressing   Problem: Safety: Goal: Ability to remain free from injury will improve Outcome: Progressing   Problem: Skin Integrity: Goal: Risk for impaired skin integrity will decrease Outcome: Progressing

## 2019-07-22 NOTE — Progress Notes (Addendum)
Inpatient Diabetes Program Recommendations  AACE/ADA: New Consensus Statement on Inpatient Glycemic Control (2015)  Target Ranges:  Prepandial:   less than 140 mg/dL      Peak postprandial:   less than 180 mg/dL (1-2 hours)      Critically ill patients:  140 - 180 mg/dL   Lab Results  Component Value Date   GLUCAP 175 (H) 07/22/2019   HGBA1C 7.3 (H) 07/19/2019    Review of Glycemic Control Results for Randy Lee, Randy Lee (MRN [id number]) as of 07/22/2019 10:28  Ref. Range 07/21/2019 07:48 07/21/2019 11:30 07/21/2019 16:30 07/21/2019 19:58 07/22/2019 07:57  Glucose-Capillary Latest Ref Range: 70 - 99 mg/dL 185 (H) 194 (H) 268 (H) 266 (H) 175 (H)   Diabetes history: DM 2 Outpatient Diabetes medications: 70/30 30 units qam, 20 units qpm,  Farxiga 10 mg Daily, Metformin 1000 mg bid Current orders for Inpatient glycemic control:  70/30 30 units qam 70/30 20 units qpm Novolog 0-15 units tid + hs Novolog 4 units tid meal coverage Invokana 100 mg Daily  A1c 7.3 on 11/30  Decadron 6 mg Q24 hours BUN/Creat: 44/1.13 No PO supplementation at this time  Inpatient Diabetes Program Recommendations:    Meal coverage is built into 70/30 insulin.  -  Consider d/cing Novolog meal coverage -  Increase morning 70/30 dose to 38-40 units  Thanks,  Tama Headings RN, MSN, BC-ADM Inpatient Diabetes Coordinator Team Pager [phone] (8a-5p)

## 2019-07-22 NOTE — Progress Notes (Signed)
PROGRESS NOTE    Randy Lee  [id number] DOB: 02/24/1951 DOA: 07/19/2019 PCP: Monico Blitz, MD   Brief Narrative:  Randy Lee 68 y.o.malewith medical history significant fordiabetes mellitus, HTN, blindness.Patient was brought to the ED by spouse with reports of cough difficulty breathing fever and generalized weakness. Patient tested positive for Covid this past Friday- 11/27.With worsening difficulty breathing, patient presented to the ED.  Assessment & Plan:   Active Problems:   Pneumonia due to COVID-19 virus   1. Acute Hypoxic Resp. Failure due to Acute Covid 19 Viral Pneumonitis O2 requirement improving, currently on RA at times to 1 L [place] Continue steroids and remdesivir Strict I/O, daily weights Daily inflammatory markers Prone as able, OOB, IS  C/o CP with cough and abd pain with cough will adjust cough meds   COVID-19 Labs  Recent Labs    07/19/19 1627 07/20/19 0910 07/21/19 0050 07/22/19 0605  DDIMER 1.12*  --  0.89* 0.68*  FERRITIN 249  --   --  483*  LDH 254*  --   --   --   CRP 10.4* 14.1* 12.1* 4.7*    No results found for: SARSCOV2NAA  2.  Legal blindness.  noted.   3.  Hypertension.  In poor control, increase Norvasc dose, continue home dose of ACE inhibitor and HCTZ and monitor.  4.  BPH.  On Flomax continue.    5.  Nonspecific single set high-sensitivity troponin obtained in ER at [facility] for reasons unclear.  Mildly elevated troponin at 21.  No chest pain documented, no EKG, patient symptom-free.  Monitor clinically.  Not an ACS pattern. Follow EKG here - sinus brady  6. DM2 - On 70/30 twice daily along with sliding scale.  Follow closely for hypoglycemia.  Mealtime insulin added on top of this.  Will monitor and adjust closely.  A1c is 7.3.  Hx Prostate Ca: will need outpatient follow up for this.  Wife discussed this with me and noted he's supposed to follow up with someone in [place] soon for pending results.   Noted he'll need to follow up outpatient regarding.  DVT prophylaxis: lovenox Code Status: full  Family Communication: none at bedside - discussed with wife Disposition Plan: pending further improvement  Consultants:   none  Procedures:   none  Antimicrobials:  Anti-infectives (From admission, onward)   Start     Dose/Rate Route Frequency Ordered Stop   07/21/19 1000  remdesivir 100 mg in sodium chloride 0.9 % 250 mL IVPB     100 mg 500 mL/hr over 30 Minutes Intravenous Daily 07/20/19 1547 07/24/19 0959   07/20/19 1600  remdesivir 100 mg in sodium chloride 0.9 % 250 mL IVPB  Status:  Discontinued     100 mg 500 mL/hr over 30 Minutes Intravenous Daily 07/20/19 0135 07/20/19 1547   07/19/19 2145  remdesivir 200 mg in sodium chloride 0.9 % 250 mL IVPB     200 mg 500 mL/hr over 30 Minutes Intravenous Once 07/19/19 2140 07/19/19 2326     Subjective: C/o CP with cough Abdominal discomfort with cough as well  Objective: Vitals:   07/22/19 0025 07/22/19 0438 07/22/19 0500 07/22/19 0759  BP: 122/73 116/76  139/82  Pulse: (!) 56 60  60  Resp: 18 20  (!) 21  Temp: 97.6 F (36.4 C) 97.7 F (36.5 C)  97.7 F (36.5 C)  TempSrc: Oral Oral  Oral  SpO2: 93% 94%  95%  Weight:   79.7 kg  Height:        Intake/Output Summary (Last 24 hours) at 07/22/2019 1530 Last data filed at 07/22/2019 1500 Gross per 24 hour  Intake 1060 ml  Output 1325 ml  Net -265 ml   Filed Weights   07/20/19 0129 07/20/19 1000 07/22/19 0500  Weight: 118.2 kg 82.1 kg 79.7 kg    Examination:  General: No acute distress. Cardiovascular: RRR Lungs: unlabored Abdomen: Soft, nontender, nondistended Neurological: Alert and oriented 3. Moves all extremities 4. Cranial nerves II through XII grossly intact. Skin: Warm and dry. No rashes or lesions. Extremities: No clubbing or cyanosis. No edema.   Data Reviewed: I have personally reviewed following labs and imaging studies  CBC: Recent Labs  Lab  07/19/19 1627 07/21/19 0050 07/22/19 0605  WBC 7.8 7.4 12.8*  NEUTROABS 6.2 6.3 11.3*  HGB 13.1 13.7 14.2  HCT 39.5 40.9 41.7  MCV 89.6 89.7 87.6  PLT 171 220 [id number]   Basic Metabolic Panel: Recent Labs  Lab 07/19/19 1627 07/21/19 0050 07/22/19 0605  NA 130* 136 131*  K 3.9 4.5 3.4*  CL 95* 96* 97*  CO2 25 25 22   GLUCOSE 191* 142* 165*  BUN 21 30* 44*  CREATININE 1.09 1.03 1.13  CALCIUM 9.0 9.9 9.6  MG  --  1.9 2.1   GFR: Estimated Creatinine Clearance: 61.7 mL/min (by C-G formula based on SCr of 1.13 mg/dL). Liver Function Tests: Recent Labs  Lab 07/19/19 1627 07/21/19 0050 07/22/19 0605  AST 43* 32 22  ALT 35 35 32  ALKPHOS 40 41 43  BILITOT 1.0 0.6 0.8  PROT 7.9 7.5 7.4  ALBUMIN 3.6 3.1* 3.1*   No results for input(s): LIPASE, AMYLASE in the last 168 hours. No results for input(s): AMMONIA in the last 168 hours. Coagulation Profile: No results for input(s): INR, PROTIME in the last 168 hours. Cardiac Enzymes: No results for input(s): CKTOTAL, CKMB, CKMBINDEX, TROPONINI in the last 168 hours. BNP (last 3 results) No results for input(s): PROBNP in the last 8760 hours. HbA1C: Recent Labs    07/19/19 1627  HGBA1C 7.3*   CBG: Recent Labs  Lab 07/21/19 1130 07/21/19 1630 07/21/19 1958 07/22/19 0757 07/22/19 1208  GLUCAP 194* 268* 266* 175* 188*   Lipid Profile: Recent Labs    07/19/19 1628  TRIG 108   Thyroid Function Tests: No results for input(s): TSH, T4TOTAL, FREET4, T3FREE, THYROIDAB in the last 72 hours. Anemia Panel: Recent Labs    07/19/19 1627 07/22/19 0605  FERRITIN 249 483*   Sepsis Labs: Recent Labs  Lab 07/19/19 1627 07/19/19 1903  PROCALCITON 0.10  --   LATICACIDVEN 1.1 1.2    Recent Results (from the past 240 hour(s))  Blood Culture (routine x 2)     Status: None (Preliminary result)   Collection Time: 07/19/19  4:27 PM   Specimen: Right Antecubital; Blood  Result Value Ref Range Status   Specimen Description  RIGHT ANTECUBITAL  Final   Special Requests   Final    BOTTLES DRAWN AEROBIC AND ANAEROBIC Blood Culture adequate volume   Culture   Final    NO GROWTH 3 DAYS Performed at [facility], [address] [place], [place] [address]    Report Status PENDING  Incomplete  Blood Culture (routine x 2)     Status: None (Preliminary result)   Collection Time: 07/19/19  4:28 PM   Specimen: Left Antecubital; Blood  Result Value Ref Range Status   Specimen Description LEFT ANTECUBITAL  Final  Special Requests   Final    BOTTLES DRAWN AEROBIC AND ANAEROBIC Blood Culture adequate volume   Culture   Final    NO GROWTH 3 DAYS Performed at [facility], [address], [place], [place] [address]    Report Status PENDING  Incomplete         Radiology Studies: No results found.      Scheduled Meds: . amLODipine  10 mg Oral Daily  . aspirin EC  81 mg Oral q morning - 10a  . canagliflozin  100 mg Oral QAC breakfast  . dexamethasone (DECADRON) injection  6 mg Intravenous Q24H  . dextromethorphan-guaiFENesin  1 tablet Oral BID  . enoxaparin (LOVENOX) injection  40 mg Subcutaneous Q24H  . finasteride  5 mg Oral q morning - 10a  . lisinopril  20 mg Oral BID   And  . hydrochlorothiazide  25 mg Oral BID  . insulin aspart  0-15 Units Subcutaneous TID WC  . insulin aspart  0-5 Units Subcutaneous QHS  . insulin aspart  4 Units Subcutaneous TID WC  . insulin aspart protamine- aspart  20 Units Subcutaneous QAC supper  . insulin aspart protamine- aspart  30 Units Subcutaneous QAC breakfast  . oxybutynin  5 mg Oral QPM  . polyethylene glycol  17 g Oral BID  . pravastatin  10 mg Oral QHS  . prednisoLONE acetate  1 drop Right Eye BID  . sodium chloride flush  3 mL Intravenous Q12H  . tamsulosin  0.4 mg Oral Daily  . timolol  1 drop Right Eye BID   Continuous Infusions: . remdesivir 100 mg in NS 250 mL 100 mg (07/22/19 1011)     LOS: 3 days    Time spent: over 30 min    Fayrene Helper,  MD [facility] Pager [email or web address]  If 7PM-7AM, please contact night-coverage [email or web address] Password [email or web address] 07/22/2019, 3:30 PM

## 2019-07-23 LAB — CBC WITH DIFFERENTIAL/PLATELET
Abs Immature Granulocytes: 0.15 10*3/uL — ABNORMAL HIGH (ref 0.00–0.07)
Basophils Absolute: 0 10*3/uL (ref 0.0–0.1)
Basophils Relative: 0 %
Eosinophils Absolute: 0 10*3/uL (ref 0.0–0.5)
Eosinophils Relative: 0 %
HCT: 41 % (ref 39.0–52.0)
Hemoglobin: 13.7 g/dL (ref 13.0–17.0)
Immature Granulocytes: 1 %
Lymphocytes Relative: 7 %
Lymphs Abs: 0.9 10*3/uL (ref 0.7–4.0)
MCH: 29.5 pg (ref 26.0–34.0)
MCHC: 33.4 g/dL (ref 30.0–36.0)
MCV: 88.4 fL (ref 80.0–100.0)
Monocytes Absolute: 0.9 10*3/uL (ref 0.1–1.0)
Monocytes Relative: 7 %
Neutro Abs: 11.5 10*3/uL — ABNORMAL HIGH (ref 1.7–7.7)
Neutrophils Relative %: 85 %
Platelets: 342 10*3/uL (ref 150–400)
RBC: 4.64 MIL/uL (ref 4.22–5.81)
RDW: 12.4 % (ref 11.5–15.5)
WBC: 13.5 10*3/uL — ABNORMAL HIGH (ref 4.0–10.5)
nRBC: 0 % (ref 0.0–0.2)

## 2019-07-23 LAB — GLUCOSE, CAPILLARY
Glucose-Capillary: 176 mg/dL — ABNORMAL HIGH (ref 70–99)
Glucose-Capillary: 220 mg/dL — ABNORMAL HIGH (ref 70–99)
Glucose-Capillary: 209 mg/dL — ABNORMAL HIGH (ref 70–99)
Glucose-Capillary: 217 mg/dL — ABNORMAL HIGH (ref 70–99)

## 2019-07-23 LAB — COMPREHENSIVE METABOLIC PANEL
ALT: 30 U/L (ref 0–44)
AST: 19 U/L (ref 15–41)
Albumin: 3.2 g/dL — ABNORMAL LOW (ref 3.5–5.0)
Alkaline Phosphatase: 43 U/L (ref 38–126)
Anion gap: 11 (ref 5–15)
BUN: 50 mg/dL — ABNORMAL HIGH (ref 8–23)
CO2: 25 mmol/L (ref 22–32)
Calcium: 10.1 mg/dL (ref 8.9–10.3)
Chloride: 100 mmol/L (ref 98–111)
Creatinine, Ser: 1.25 mg/dL — ABNORMAL HIGH (ref 0.61–1.24)
GFR calc Af Amer: >60 mL/min (ref >60)
GFR calc non Af Amer: 58 mL/min — ABNORMAL LOW (ref >60)
Glucose, Bld: 133 mg/dL — ABNORMAL HIGH (ref 70–99)
Potassium: 4.5 mmol/L (ref 3.5–5.1)
Sodium: 136 mmol/L (ref 135–145)
Total Bilirubin: 0.6 mg/dL (ref 0.3–1.2)
Total Protein: 7.2 g/dL (ref 6.5–8.1)

## 2019-07-23 LAB — PROCALCITONIN: Procalcitonin: <0.1 ng/mL

## 2019-07-23 LAB — BRAIN NATRIURETIC PEPTIDE: B Natriuretic Peptide: 38.4 pg/mL (ref 0.0–100.0)

## 2019-07-23 LAB — C-REACTIVE PROTEIN: CRP: 2.7 mg/dL — ABNORMAL HIGH (ref <1.0)

## 2019-07-23 LAB — MAGNESIUM: Magnesium: 2.4 mg/dL (ref 1.7–2.4)

## 2019-07-23 LAB — D-DIMER, QUANTITATIVE: D-Dimer, Quant: 0.49 ug/mL-FEU (ref 0.00–0.50)

## 2019-07-23 MED ORDER — INSULIN ASPART PROT & ASPART (70-30 MIX) 100 UNIT/ML ~~LOC~~ SUSP
35.0000 [IU] | Freq: Every day | SUBCUTANEOUS | Status: DC
  Administered 2019-07-24 – 2019-07-29 (×6): 35 [IU] via SUBCUTANEOUS
  Filled 2019-07-23: qty 10

## 2019-07-23 NOTE — Plan of Care (Signed)

## 2019-07-23 NOTE — Plan of Care (Signed)
No acute events during this shift. VSS and WDL for this pt. No complaints at this time. Pt resting with eyes closed, no s/s of distress. Call bell within reach of pt, pt was encouraged to use call bell for assistance. No complaints of SOB or pain. Report to be given to oncoming shift. Continue to monitor

## 2019-07-23 NOTE — Progress Notes (Signed)
Occupational Therapy Treatment Patient Details Name: Randy Lee MRN: [id number] DOB: 11-15-1950 Today's Date: 07/23/2019    History of present illness 68 y/o man w/ hx of pruritic disorder, HTN, DM II, hepatitis, esophageal reflux, dyspnea, chets pain, cancer, blindness, acute pharyngitis. cervial spine surgery, liver biopsy, wound debrodement, cornea transplant x 2. pt presented to ED w/ SOB was dx with COVID 11/27   OT comments  Pt on 1 L at rest SpO2 93. Ambulated to bathroom for toileting then completed grooming at sink. Noted dest to 78 with good pleth. Once seated, SpO2 remained in low 80s with increased coughing. Required 3L to return to low 90s. Pt complaining of chest pain but states he feels it is from coughing. Pt having difficulty completing incentive spirometer due to coughing. Nsg and MD made aware of increased need for O2 with activity. DC plan is SNF.   Follow Up Recommendations  SNF;Home health OT;Supervision/Assistance - 24 hour(pending progress)    Equipment Recommendations  Other (comment)(TBA)    Recommendations for Other Services      Precautions / Restrictions Precautions Precautions: Fall Precaution Comments: blind Restrictions Weight Bearing Restrictions: No       Mobility Bed Mobility               General bed mobility comments: OOB in chair  Transfers Overall transfer level: Needs assistance Equipment used: Rolling walker (2 wheeled) Transfers: Sit to/from Stand Sit to Stand: Supervision              Balance Overall balance assessment: Mild deficits observed, not formally tested                                         ADL either performed or assessed with clinical judgement   ADL Overall ADL's : Needs assistance/impaired     Grooming: Modified independent;Standing   Upper Body Bathing: Set up;Sitting   Lower Body Bathing: Set up;Sit to/from stand   Upper Body Dressing : Set up;Sitting   Lower Body  Dressing: Set up;Supervision/safety;Sit to/from stand   Toilet Transfer: Chartered certified accountant and Hygiene: Modified independent       Functional mobility during ADLs: Supervision/safety;Rolling walker       Vision       Perception     Praxis      Cognition Arousal/Alertness: Awake/alert Behavior During Therapy: WFL for tasks assessed/performed Overall Cognitive Status: Within Functional Limits for tasks assessed                                          Exercises Exercises: Other exercises Other Exercises Other Exercises: flutter valve x 10  Other Exercises: incentive spirometer x 10 - coudhing and difficulty with inhaling   Shoulder Instructions       General Comments      Pertinent Vitals/ Pain       Pain Assessment: No/denies pain  Home Living                                          Prior Functioning/Environment              Frequency  Min 3X/week  Progress Toward Goals  OT Goals(current goals can now be found in the care plan section)  Progress towards OT goals: Progressing toward goals  Acute Rehab OT Goals Patient Stated Goal: to get better OT Goal Formulation: With patient Time For Goal Achievement: 08/04/19 Potential to Achieve Goals: Good ADL Goals Pt Will Perform Lower Body Bathing: with modified independence;sit to/from stand Pt Will Perform Lower Body Dressing: with modified independence;sit to/from stand Pt Will Transfer to Toilet: with modified independence;ambulating Pt Will Perform Toileting - Clothing Manipulation and hygiene: with modified independence;sit to/from stand Additional ADL Goal #1: Pt will independently identify 3 strategies to reduce risk of falls  Plan Discharge plan needs to be updated    Co-evaluation                 AM-PAC OT "6 Clicks" Daily Activity     Outcome Measure   Help from another  person eating meals?: None Help from another person taking care of personal grooming?: A Little Help from another person toileting, which includes using toliet, bedpan, or urinal?: A Little Help from another person bathing (including washing, rinsing, drying)?: A Little Help from another person to put on and taking off regular upper body clothing?: A Little Help from another person to put on and taking off regular lower body clothing?: A Little 6 Click Score: 19    End of Session Equipment Utilized During Treatment: Rolling walker;Oxygen  OT Visit Diagnosis: Unsteadiness on feet (R26.81);Muscle weakness (generalized) (M62.81)   Activity Tolerance Patient tolerated treatment well   Patient Left in chair;with call bell/phone within reach   Nurse Communication Mobility status        Time: 1445-1510 OT Time Calculation (min): 25 min  Charges: OT General Charges $OT Visit: 1 Visit OT Treatments $Self Care/Home Management : 23-37 mins  Maurie Boettcher, OT/L   Acute OT Clinical Specialist [facility] Pager [phone] Office [phone]    [facility] 07/23/2019, 4:23 PM

## 2019-07-23 NOTE — Progress Notes (Signed)
Physical Therapy Treatment Patient Details Name: Randy Lee MRN: [id number] DOB: 06-Aug-1951 Today's Date: 07/23/2019    History of Present Illness 68 y/o man w/ hx of pruritic disorder, HTN, DM II, hepatitis, esophageal reflux, dyspnea, chets pain, cancer, blindness, acute pharyngitis. cervial spine surgery, liver biopsy, wound debrodement, cornea transplant x 2. pt presented to ED w/ SOB was dx with COVID 11/27    PT Comments    Pt found sitting in recliner and is agreeable to tx. Pt able to get up from recliner with RW, SBA and cues for safety, ambulated approx 117ft with RW and SBA-min guard assist. Pt was on 1 L/min of 02 noted to desat to min of 85% w/ rest and cues for pursed lip breathing able to recover to 90s. Discussed with pt d/c plan, he is requesting going to post acute care level rehab. Therapist explained all options and therapist recommendation and pt states he would like to go to rehab for even a few weeks to get stringer again. Pt lives alone in retirement community, states his spouse is in same community but not same house, she may help some but will not always be there. He states that he has hard contacts which help him see some, therapist is not quite sure how much vision pt has with and without contacts. After tx noted that pt was coughing much more than previously.   Follow Up Recommendations  SNF     Equipment Recommendations  None recommended by PT    Recommendations for Other Services       Precautions / Restrictions Precautions Precautions: Fall Precaution Comments: blind Restrictions Weight Bearing Restrictions: No    Mobility  Bed Mobility               General bed mobility comments: OOB in chair  Transfers Overall transfer level: Needs assistance Equipment used: Rolling walker (2 wheeled) Transfers: Sit to/from Stand Sit to Stand: Supervision            Ambulation/Gait Ambulation/Gait assistance: Min guard;Supervision Gait  Distance (Feet): 160 Feet Assistive device: Rolling walker (2 wheeled) Gait Pattern/deviations: Step-through pattern;Ataxic Gait velocity: decreased   General Gait Details: ataxia in RLE, able to ambulate in hall but needs verbal cues for safety, ambulated on 1L/min via [place] desat to min 85% but able to quickly recover to 90s again   Stairs             Wheelchair Mobility    Modified Rankin (Stroke Patients Only)       Balance Overall balance assessment: Mild deficits observed, not formally tested                                          Cognition Arousal/Alertness: Awake/alert Behavior During Therapy: WFL for tasks assessed/performed Overall Cognitive Status: Within Functional Limits for tasks assessed                                        Exercises Other Exercises Other Exercises: flutter valve x 10  Other Exercises: incentive spirometer x 10 - coudhing and difficulty with inhaling    General Comments        Pertinent Vitals/Pain Pain Assessment: No/denies pain    Home Living  Prior Function            PT Goals (current goals can now be found in the care plan section) Acute Rehab PT Goals Patient Stated Goal: to get better    Frequency    Min 2X/week      PT Plan Current plan remains appropriate    Co-evaluation              AM-PAC PT "6 Clicks" Mobility   Outcome Measure                   End of Session Equipment Utilized During Treatment: Oxygen Activity Tolerance: Patient tolerated treatment well Patient left: in chair;with call bell/phone within reach Nurse Communication: Mobility status PT Visit Diagnosis: Other abnormalities of gait and mobility (R26.89);Muscle weakness (generalized) (M62.81)     Time: [id number] PT Time Calculation (min) (ACUTE ONLY): 18 min  Charges:  $Gait Training: 8-22 mins                     Randy Lee, PT    Randy Lee 07/23/2019, 4:23 PM
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
## 2019-07-23 NOTE — TOC Progression Note (Addendum)
Transition of Care (TOC) - Progression Note  Marvetta Gibbons RN, BSN Transitions of Care Unit 4E- RN Case Manager ([place] coverage) [phone]   Patient Details  Name: Randy Lee MRN: [id number] Date of Birth: September 05, 1950  Transition of Care [facility]) CM/SW Contact  Dahlia Client [name], RN Phone Number: 07/23/2019, 2:44 PM  Clinical Narrative:    Follow up done with patients wife regarding STSNF bed offers. TC made and discussed bed offers for [facility] and [facility]. Per wife she would like to accept bed offer for [facility] as that would be closer to them in [place]. PASRR is pending- MD to sign 30 day note for PASRR review. CM/CSW will continue to follow for transition to SNF early next week once insurance auth. And PASRR received.    Expected Discharge Plan: [place] Barriers to Discharge: Continued Medical Work up  Expected Discharge Plan and Services Expected Discharge Plan: [place] arrangements for the past 2 months: Apartment(Senior housing)                                       Social Determinants of Health (SDOH) Interventions    Readmission Risk Interventions No flowsheet data found.

## 2019-07-23 NOTE — Progress Notes (Signed)
Updated family of situation and plan.  All questions answered.

## 2019-07-24 ENCOUNTER — Inpatient Hospital Stay (HOSPITAL_COMMUNITY): Payer: Medicare PPO

## 2019-07-24 LAB — CULTURE, BLOOD (ROUTINE X 2)
Culture: NO GROWTH
Culture: NO GROWTH
Special Requests: ADEQUATE
Special Requests: ADEQUATE

## 2019-07-24 LAB — CBC WITH DIFFERENTIAL/PLATELET
Abs Immature Granulocytes: 0.14 10*3/uL — ABNORMAL HIGH (ref 0.00–0.07)
Basophils Absolute: 0 10*3/uL (ref 0.0–0.1)
Basophils Relative: 0 %
Eosinophils Absolute: 0 10*3/uL (ref 0.0–0.5)
Eosinophils Relative: 0 %
HCT: 40.2 % (ref 39.0–52.0)
Hemoglobin: 13.7 g/dL (ref 13.0–17.0)
Immature Granulocytes: 1 %
Lymphocytes Relative: 9 %
Lymphs Abs: 1.1 10*3/uL (ref 0.7–4.0)
MCH: 29.5 pg (ref 26.0–34.0)
MCHC: 34.1 g/dL (ref 30.0–36.0)
MCV: 86.6 fL (ref 80.0–100.0)
Monocytes Absolute: 0.8 10*3/uL (ref 0.1–1.0)
Monocytes Relative: 7 %
Neutro Abs: 10.1 10*3/uL — ABNORMAL HIGH (ref 1.7–7.7)
Neutrophils Relative %: 83 %
Platelets: 342 10*3/uL (ref 150–400)
RBC: 4.64 MIL/uL (ref 4.22–5.81)
RDW: 12.2 % (ref 11.5–15.5)
WBC: 12.2 10*3/uL — ABNORMAL HIGH (ref 4.0–10.5)
nRBC: 0.2 % (ref 0.0–0.2)

## 2019-07-24 LAB — COMPREHENSIVE METABOLIC PANEL
ALT: 24 U/L (ref 0–44)
AST: 17 U/L (ref 15–41)
Albumin: 3 g/dL — ABNORMAL LOW (ref 3.5–5.0)
Alkaline Phosphatase: 42 U/L (ref 38–126)
Anion gap: 13 (ref 5–15)
BUN: 41 mg/dL — ABNORMAL HIGH (ref 8–23)
CO2: 23 mmol/L (ref 22–32)
Calcium: 9.7 mg/dL (ref 8.9–10.3)
Chloride: 96 mmol/L — ABNORMAL LOW (ref 98–111)
Creatinine, Ser: 1.17 mg/dL (ref 0.61–1.24)
GFR calc Af Amer: >60 mL/min (ref >60)
GFR calc non Af Amer: >60 mL/min (ref >60)
Glucose, Bld: 243 mg/dL — ABNORMAL HIGH (ref 70–99)
Potassium: 4.1 mmol/L (ref 3.5–5.1)
Sodium: 132 mmol/L — ABNORMAL LOW (ref 135–145)
Total Bilirubin: 0.5 mg/dL (ref 0.3–1.2)
Total Protein: 6.6 g/dL (ref 6.5–8.1)

## 2019-07-24 LAB — D-DIMER, QUANTITATIVE: D-Dimer, Quant: 0.4 ug/mL-FEU (ref 0.00–0.50)

## 2019-07-24 LAB — MAGNESIUM: Magnesium: 2.2 mg/dL (ref 1.7–2.4)

## 2019-07-24 LAB — GLUCOSE, CAPILLARY
Glucose-Capillary: 166 mg/dL — ABNORMAL HIGH (ref 70–99)
Glucose-Capillary: 211 mg/dL — ABNORMAL HIGH (ref 70–99)
Glucose-Capillary: 297 mg/dL — ABNORMAL HIGH (ref 70–99)
Glucose-Capillary: 200 mg/dL — ABNORMAL HIGH (ref 70–99)

## 2019-07-24 LAB — BRAIN NATRIURETIC PEPTIDE: B Natriuretic Peptide: 34.5 pg/mL (ref 0.0–100.0)

## 2019-07-24 LAB — C-REACTIVE PROTEIN: CRP: 1.6 mg/dL — ABNORMAL HIGH (ref <1.0)

## 2019-07-24 LAB — PROCALCITONIN: Procalcitonin: <0.1 ng/mL
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
## 2019-07-24 NOTE — TOC Progression Note (Signed)
Transition of Care [facility]) - Progression Note    Patient Details  Name: [name] MRN: [id number] Date of Birth: 02-03-1951  Transition of Care [facility]) CM/SW [place], [place] Phone Number: [phone] 07/24/2019, 10:15 AM  Clinical Narrative:     CSW has initiated insurance authorization with [facility]. Pending insurance auth at this time. PASRR number continues to pend.   Expected Discharge Plan: [place] Barriers to Discharge: Continued Medical Work up  Expected Discharge Plan and Services Expected Discharge Plan: [place] arrangements for the past 2 months: Apartment(Senior housing)                                       Social Determinants of Health (SDOH) Interventions    Readmission Risk Interventions No flowsheet data found.

## 2019-07-24 NOTE — Progress Notes (Signed)
Patient maintained himself in his chair for the majority of the afternoon.  He consumed his meals and watch tv.  Vitals WDL. AOx4. Patient maintains good saturations on 3L [place].  Bathed patient today.  He is very good with standing and ambulation but does suffer from blindness.  Patient is calm and cooperative.  Patient stated no pain for the day.

## 2019-07-25 LAB — CBC WITH DIFFERENTIAL/PLATELET
Abs Immature Granulocytes: 0.27 10*3/uL — ABNORMAL HIGH (ref 0.00–0.07)
Basophils Absolute: 0.1 10*3/uL (ref 0.0–0.1)
Basophils Relative: 0 %
Eosinophils Absolute: 0 10*3/uL (ref 0.0–0.5)
Eosinophils Relative: 0 %
HCT: 41.8 % (ref 39.0–52.0)
Hemoglobin: 14.1 g/dL (ref 13.0–17.0)
Immature Granulocytes: 2 %
Lymphocytes Relative: 10 %
Lymphs Abs: 1.2 10*3/uL (ref 0.7–4.0)
MCH: 29.7 pg (ref 26.0–34.0)
MCHC: 33.7 g/dL (ref 30.0–36.0)
MCV: 88 fL (ref 80.0–100.0)
Monocytes Absolute: 1 10*3/uL (ref 0.1–1.0)
Monocytes Relative: 8 %
Neutro Abs: 10.3 10*3/uL — ABNORMAL HIGH (ref 1.7–7.7)
Neutrophils Relative %: 80 %
Platelets: 387 10*3/uL (ref 150–400)
RBC: 4.75 MIL/uL (ref 4.22–5.81)
RDW: 12.3 % (ref 11.5–15.5)
WBC: 12.9 10*3/uL — ABNORMAL HIGH (ref 4.0–10.5)
nRBC: 0.2 % (ref 0.0–0.2)

## 2019-07-25 LAB — FERRITIN: Ferritin: 335 ng/mL (ref 24–336)

## 2019-07-25 LAB — COMPREHENSIVE METABOLIC PANEL
ALT: 23 U/L (ref 0–44)
AST: 15 U/L (ref 15–41)
Albumin: 3.1 g/dL — ABNORMAL LOW (ref 3.5–5.0)
Alkaline Phosphatase: 43 U/L (ref 38–126)
Anion gap: 13 (ref 5–15)
BUN: 41 mg/dL — ABNORMAL HIGH (ref 8–23)
CO2: 25 mmol/L (ref 22–32)
Calcium: 10.2 mg/dL (ref 8.9–10.3)
Chloride: 98 mmol/L (ref 98–111)
Creatinine, Ser: 1.3 mg/dL — ABNORMAL HIGH (ref 0.61–1.24)
GFR calc Af Amer: >60 mL/min (ref >60)
GFR calc non Af Amer: 56 mL/min — ABNORMAL LOW (ref >60)
Glucose, Bld: 166 mg/dL — ABNORMAL HIGH (ref 70–99)
Potassium: 4.4 mmol/L (ref 3.5–5.1)
Sodium: 136 mmol/L (ref 135–145)
Total Bilirubin: 0.7 mg/dL (ref 0.3–1.2)
Total Protein: 7.1 g/dL (ref 6.5–8.1)

## 2019-07-25 LAB — GLUCOSE, CAPILLARY
Glucose-Capillary: 161 mg/dL — ABNORMAL HIGH (ref 70–99)
Glucose-Capillary: 197 mg/dL — ABNORMAL HIGH (ref 70–99)
Glucose-Capillary: 216 mg/dL — ABNORMAL HIGH (ref 70–99)
Glucose-Capillary: 271 mg/dL — ABNORMAL HIGH (ref 70–99)

## 2019-07-25 LAB — C-REACTIVE PROTEIN: CRP: 0.8 mg/dL (ref <1.0)

## 2019-07-25 LAB — D-DIMER, QUANTITATIVE: D-Dimer, Quant: 0.36 ug/mL-FEU (ref 0.00–0.50)

## 2019-07-25 LAB — MAGNESIUM: Magnesium: 2.2 mg/dL (ref 1.7–2.4)

## 2019-07-25 LAB — PROCALCITONIN: Procalcitonin: <0.1 ng/mL

## 2019-07-25 NOTE — Progress Notes (Signed)
PROGRESS NOTE    [name]  [id number] DOB: [birth year] DOA: 07/19/2019 PCP: Monico Blitz, MD   Brief Narrative:  Randy Lee 68 y.o.malewith medical history significant fordiabetes mellitus, HTN, blindness.Patient was brought to the ED by spouse with reports of cough difficulty breathing fever and generalized weakness. Patient tested positive for Covid this past Friday- 11/27.With worsening difficulty breathing, patient presented to the ED.  Assessment & Plan:   Active Problems:   Pneumonia due to COVID-19 virus  1. Acute Hypoxic Resp. Failure due to Acute Covid 19 Viral Pneumonitis Recent O2 needs fluctuating, currently on 3 L  Continue steroids (continue) and remdesivir - completed course 12/4 Strict I/O, daily weights Daily inflammatory markers Prone as able, OOB, IS  C/o CP with cough and abd pain with cough will adjust cough meds 12/3 CXR with bilateral lung opacities with worsening in L lung CXR 12/5 with stable patchy opacities   COVID-19 Labs  Recent Labs    07/23/19 0000 07/24/19 0142 07/25/19 0301  DDIMER 0.49 0.40 0.36  FERRITIN  --   --  335  CRP 2.7* 1.6* 0.8    No results found for: SARSCOV2NAA  2.  Legal blindness.  noted.   3.  Hypertension.  In poor control, increase Norvasc dose, continue home dose of ACE inhibitor and HCTZ and monitor.  4.  BPH.  On Flomax continue.    5.  Nonspecific single set high-sensitivity troponin obtained in ER at [facility] for reasons unclear.  Mildly elevated troponin at 21.  No chest pain documented, no EKG, patient symptom-free.  Monitor clinically.  Not an ACS pattern. Follow EKG here - sinus brady  6. DM2 - On 70/30 twice daily along with sliding scale (increase AM dose).  Follow closely for hypoglycemia.  Mealtime insulin added on top of this.  Will monitor and adjust closely.  A1c is 7.3.  Hx Prostate Ca: will need outpatient follow up for this.  Wife discussed this with me and  noted he's supposed to follow up with someone in [place] soon for pending results.  Noted he'll need to follow up outpatient regarding.  Elevated Creatinine: follow with ACE and thiazide - fluctuating, follow  DVT prophylaxis: lovenox Code Status: full  Family Communication: none at bedside - discussed with wife Disposition Plan: pending further improvement  Consultants:   none  Procedures:   none  Antimicrobials:  Anti-infectives (From admission, onward)   Start     Dose/Rate Route Frequency Ordered Stop   07/21/19 1000  remdesivir 100 mg in sodium chloride 0.9 % 250 mL IVPB     100 mg 500 mL/hr over 30 Minutes Intravenous Daily 07/20/19 1547 07/23/19 1049   07/20/19 1600  remdesivir 100 mg in sodium chloride 0.9 % 250 mL IVPB  Status:  Discontinued     100 mg 500 mL/hr over 30 Minutes Intravenous Daily 07/20/19 0135 07/20/19 1547   07/19/19 2145  remdesivir 200 mg in sodium chloride 0.9 % 250 mL IVPB     200 mg 500 mL/hr over 30 Minutes Intravenous Once 07/19/19 2140 07/19/19 2326     Subjective: No new complaints Cough is persistent, but pleuritic CP improved Abdominal pain from cough improved  Objective: Vitals:   07/25/19 0900 07/25/19 0947 07/25/19 1300 07/25/19 1625  BP:  130/77 107/67   Pulse: (!) 57     Resp: 17     Temp:  (!) 97.5 F (36.4 C) 97.6 F (36.4 C) (!) 97.4 F (36.3  C)  TempSrc:  Oral Oral Oral  SpO2: 95%     Weight:      Height:        Intake/Output Summary (Last 24 hours) at 07/25/2019 1754 Last data filed at 07/25/2019 1626 Gross per 24 hour  Intake -  Output 1850 ml  Net -1850 ml   Filed Weights   07/23/19 0500 07/24/19 0500 07/25/19 0452  Weight: 78.1 kg 80.3 kg 81.3 kg    Examination:  General: No acute distress. Cardiovascular:RRR Lungs: unlabored Abdomen: Soft, nontender, nondistended w Neurological: Alert and oriented 3. Moves all extremities 4 w. Cranial nerves II through XII grossly intact. Skin: Warm and dry. No  rashes or lesions. Extremities: No clubbing or cyanosis. No edema.  Data Reviewed: I have personally reviewed following labs and imaging studies  CBC: Recent Labs  Lab 07/21/19 0050 07/22/19 0605 07/23/19 0000 07/24/19 0142 07/25/19 0301  WBC 7.4 12.8* 13.5* 12.2* 12.9*  NEUTROABS 6.3 11.3* 11.5* 10.1* 10.3*  HGB 13.7 14.2 13.7 13.7 14.1  HCT 40.9 41.7 41.0 40.2 41.8  MCV 89.7 87.6 88.4 86.6 88.0  PLT 220 248 342 342 [id number]   Basic Metabolic Panel: Recent Labs  Lab 07/21/19 0050 07/22/19 0605 07/23/19 0000 07/24/19 0142 07/25/19 0301  NA 136 131* 136 132* 136  K 4.5 3.4* 4.5 4.1 4.4  CL 96* 97* 100 96* 98  CO2 25 22 25 23 25   GLUCOSE 142* 165* 133* 243* 166*  BUN 30* 44* 50* 41* 41*  CREATININE 1.03 1.13 1.25* 1.17 1.30*  CALCIUM 9.9 9.6 10.1 9.7 10.2  MG 1.9 2.1 2.4 2.2 2.2   GFR: Estimated Creatinine Clearance: 53.6 mL/min (Yoselin Amerman) (by C-G formula based on SCr of 1.3 mg/dL (H)). Liver Function Tests: Recent Labs  Lab 07/21/19 0050 07/22/19 0605 07/23/19 0000 07/24/19 0142 07/25/19 0301  AST 32 22 19 17 15   ALT 35 32 30 24 23   ALKPHOS 41 43 43 42 43  BILITOT 0.6 0.8 0.6 0.5 0.7  PROT 7.5 7.4 7.2 6.6 7.1  ALBUMIN 3.1* 3.1* 3.2* 3.0* 3.1*   No results for input(s): LIPASE, AMYLASE in the last 168 hours. No results for input(s): AMMONIA in the last 168 hours. Coagulation Profile: No results for input(s): INR, PROTIME in the last 168 hours. Cardiac Enzymes: No results for input(s): CKTOTAL, CKMB, CKMBINDEX, TROPONINI in the last 168 hours. BNP (last 3 results) No results for input(s): PROBNP in the last 8760 hours. HbA1C: No results for input(s): HGBA1C in the last 72 hours. CBG: Recent Labs  Lab 07/24/19 1959 07/24/19 2150 07/25/19 0904 07/25/19 1305 07/25/19 1622  GLUCAP 297* 200* 161* 197* 216*   Lipid Profile: No results for input(s): CHOL, HDL, LDLCALC, TRIG, CHOLHDL, LDLDIRECT in the last 72 hours. Thyroid Function Tests: No results for  input(s): TSH, T4TOTAL, FREET4, T3FREE, THYROIDAB in the last 72 hours. Anemia Panel: Recent Labs    07/25/19 0301  FERRITIN 335   Sepsis Labs: Recent Labs  Lab 07/19/19 1627 07/19/19 1903 07/23/19 1620 07/24/19 0142 07/25/19 0301  PROCALCITON 0.10  --  <0.10 <0.10 <0.10  LATICACIDVEN 1.1 1.2  --   --   --     Recent Results (from the past 240 hour(s))  Blood Culture (routine x 2)     Status: None   Collection Time: 07/19/19  4:27 PM   Specimen: Right Antecubital; Blood  Result Value Ref Range Status   Specimen Description RIGHT ANTECUBITAL  Final   Special Requests  Final    BOTTLES DRAWN AEROBIC AND ANAEROBIC Blood Culture adequate volume   Culture   Final    NO GROWTH 5 DAYS Performed at [facility], [address], [place], [place] [address]    Report Status 07/24/2019 FINAL  Final  Blood Culture (routine x 2)     Status: None   Collection Time: 07/19/19  4:28 PM   Specimen: Left Antecubital; Blood  Result Value Ref Range Status   Specimen Description LEFT ANTECUBITAL  Final   Special Requests   Final    BOTTLES DRAWN AEROBIC AND ANAEROBIC Blood Culture adequate volume   Culture   Final    NO GROWTH 5 DAYS Performed at [facility], [address], [place], [place] [address]    Report Status 07/24/2019 FINAL  Final         Radiology Studies: Dg Chest Port 1 View  Result Date: 07/24/2019 CLINICAL DATA:  Hypoxia, COVID-19 EXAM: PORTABLE CHEST 1 VIEW COMPARISON:  07/22/2019 FINDINGS: Subpleural patchy opacities in the mid/lower lungs bilaterally, unchanged. No pleural effusion or pneumothorax. The heart is normal in size. Shrapnel in the right lateral chest wall/axilla. IMPRESSION: Stable patchy opacities in the mid/lower lungs bilaterally, likely related to patient's known COVID-19. Electronically Signed   By: Julian Hy M.D.   On: 07/24/2019 11:02        Scheduled Meds: . amLODipine  10 mg Oral Daily  . aspirin EC  81 mg Oral q morning - 10a  .  canagliflozin  100 mg Oral QAC breakfast  . dexamethasone (DECADRON) injection  6 mg Intravenous Q24H  . dextromethorphan-guaiFENesin  1 tablet Oral BID  . enoxaparin (LOVENOX) injection  40 mg Subcutaneous Q24H  . finasteride  5 mg Oral q morning - 10a  . lisinopril  20 mg Oral BID   And  . hydrochlorothiazide  25 mg Oral BID  . insulin aspart  0-15 Units Subcutaneous TID WC  . insulin aspart  0-5 Units Subcutaneous QHS  . insulin aspart protamine- aspart  20 Units Subcutaneous QAC supper  . insulin aspart protamine- aspart  35 Units Subcutaneous QAC breakfast  . oxybutynin  5 mg Oral QPM  . polyethylene glycol  17 g Oral BID  . pravastatin  10 mg Oral QHS  . prednisoLONE acetate  1 drop Right Eye BID  . sodium chloride flush  3 mL Intravenous Q12H  . tamsulosin  0.4 mg Oral Daily  . timolol  1 drop Right Eye BID   Continuous Infusions:    LOS: 6 days    Time spent: over 30 min    Fayrene Helper, MD [facility] Pager AMION  If 7PM-7AM, please contact night-coverage [email or web address] Password [facility] 07/25/2019, 5:54 PM

## 2019-07-26 LAB — CBC WITH DIFFERENTIAL/PLATELET
Abs Immature Granulocytes: 0.52 10*3/uL — ABNORMAL HIGH (ref 0.00–0.07)
Basophils Absolute: 0.1 10*3/uL (ref 0.0–0.1)
Basophils Relative: 0 %
Eosinophils Absolute: 0 10*3/uL (ref 0.0–0.5)
Eosinophils Relative: 0 %
HCT: 41.1 % (ref 39.0–52.0)
Hemoglobin: 13.9 g/dL (ref 13.0–17.0)
Immature Granulocytes: 4 %
Lymphocytes Relative: 10 %
Lymphs Abs: 1.4 10*3/uL (ref 0.7–4.0)
MCH: 29.8 pg (ref 26.0–34.0)
MCHC: 33.8 g/dL (ref 30.0–36.0)
MCV: 88.2 fL (ref 80.0–100.0)
Monocytes Absolute: 1 10*3/uL (ref 0.1–1.0)
Monocytes Relative: 7 %
Neutro Abs: 10.9 10*3/uL — ABNORMAL HIGH (ref 1.7–7.7)
Neutrophils Relative %: 79 %
Platelets: 411 10*3/uL — ABNORMAL HIGH (ref 150–400)
RBC: 4.66 MIL/uL (ref 4.22–5.81)
RDW: 12.3 % (ref 11.5–15.5)
WBC: 13.9 10*3/uL — ABNORMAL HIGH (ref 4.0–10.5)
nRBC: 0.1 % (ref 0.0–0.2)

## 2019-07-26 LAB — COMPREHENSIVE METABOLIC PANEL
ALT: 23 U/L (ref 0–44)
AST: 14 U/L — ABNORMAL LOW (ref 15–41)
Albumin: 3 g/dL — ABNORMAL LOW (ref 3.5–5.0)
Alkaline Phosphatase: 43 U/L (ref 38–126)
Anion gap: 16 — ABNORMAL HIGH (ref 5–15)
BUN: 46 mg/dL — ABNORMAL HIGH (ref 8–23)
CO2: 23 mmol/L (ref 22–32)
Calcium: 10.2 mg/dL (ref 8.9–10.3)
Chloride: 95 mmol/L — ABNORMAL LOW (ref 98–111)
Creatinine, Ser: 1.25 mg/dL — ABNORMAL HIGH (ref 0.61–1.24)
GFR calc Af Amer: >60 mL/min (ref >60)
GFR calc non Af Amer: 58 mL/min — ABNORMAL LOW (ref >60)
Glucose, Bld: 193 mg/dL — ABNORMAL HIGH (ref 70–99)
Potassium: 4.1 mmol/L (ref 3.5–5.1)
Sodium: 134 mmol/L — ABNORMAL LOW (ref 135–145)
Total Bilirubin: 0.5 mg/dL (ref 0.3–1.2)
Total Protein: 6.9 g/dL (ref 6.5–8.1)

## 2019-07-26 LAB — GLUCOSE, CAPILLARY
Glucose-Capillary: 269 mg/dL — ABNORMAL HIGH (ref 70–99)
Glucose-Capillary: 135 mg/dL — ABNORMAL HIGH (ref 70–99)
Glucose-Capillary: 298 mg/dL — ABNORMAL HIGH (ref 70–99)
Glucose-Capillary: 312 mg/dL — ABNORMAL HIGH (ref 70–99)
Glucose-Capillary: 295 mg/dL — ABNORMAL HIGH (ref 70–99)

## 2019-07-26 LAB — C-REACTIVE PROTEIN: CRP: 0.7 mg/dL (ref <1.0)

## 2019-07-26 LAB — MAGNESIUM: Magnesium: 2.1 mg/dL (ref 1.7–2.4)

## 2019-07-26 LAB — D-DIMER, QUANTITATIVE: D-Dimer, Quant: 0.33 ug/mL-FEU (ref 0.00–0.50)

## 2019-07-26 LAB — FERRITIN: Ferritin: 354 ng/mL — ABNORMAL HIGH (ref 24–336)

## 2019-07-26 NOTE — Plan of Care (Addendum)
Patient up to chair majority of day. All medication given well tolerated. No s/s of pain or distress. Will continue to monitor for remainder of shift. Updated wife all questions answered.   Problem: Education: Goal: Knowledge of General Education information will improve Description: Including pain rating scale, medication(s)/side effects and non-pharmacologic comfort measures 07/26/2019 1705 by Orvan Falconer, RN Outcome: Progressing 07/26/2019 1152 by Orvan Falconer, RN Outcome: Progressing   Problem: Health Behavior/Discharge Planning: Goal: Ability to manage health-related needs will improve 07/26/2019 1705 by Orvan Falconer, RN Outcome: Progressing 07/26/2019 1152 by Orvan Falconer, RN Outcome: Progressing   Problem: Clinical Measurements: Goal: Ability to maintain clinical measurements within normal limits will improve 07/26/2019 1705 by Orvan Falconer, RN Outcome: Progressing 07/26/2019 1152 by Orvan Falconer, RN Outcome: Progressing Goal: Will remain free from infection 07/26/2019 1705 by Orvan Falconer, RN Outcome: Progressing 07/26/2019 1152 by Orvan Falconer, RN Outcome: Progressing Goal: Diagnostic test results will improve 07/26/2019 1705 by Orvan Falconer, RN Outcome: Progressing 07/26/2019 1152 by Orvan Falconer, RN Outcome: Progressing Goal: Respiratory complications will improve 07/26/2019 1705 by Orvan Falconer, RN Outcome: Progressing 07/26/2019 1152 by Orvan Falconer, RN Outcome: Progressing Goal: Cardiovascular complication will be avoided 07/26/2019 1705 by Orvan Falconer, RN Outcome: Progressing 07/26/2019 1152 by Orvan Falconer, RN Outcome: Progressing   Problem: Activity: Goal: Risk for activity intolerance will decrease 07/26/2019 1705 by Orvan Falconer, RN Outcome: Progressing 07/26/2019 1152 by Orvan Falconer, RN Outcome: Progressing   Problem: Nutrition: Goal: Adequate nutrition will be  maintained 07/26/2019 1705 by Orvan Falconer, RN Outcome: Progressing 07/26/2019 1152 by Orvan Falconer, RN Outcome: Progressing   Problem: Coping: Goal: Level of anxiety will decrease 07/26/2019 1705 by Orvan Falconer, RN Outcome: Progressing 07/26/2019 1152 by Orvan Falconer, RN Outcome: Progressing   Problem: Elimination: Goal: Will not experience complications related to bowel motility 07/26/2019 1705 by Orvan Falconer, RN Outcome: Progressing 07/26/2019 1152 by Orvan Falconer, RN Outcome: Progressing Goal: Will not experience complications related to urinary retention 07/26/2019 1705 by Orvan Falconer, RN Outcome: Progressing 07/26/2019 1152 by Orvan Falconer, RN Outcome: Progressing   Problem: Pain Managment: Goal: General experience of comfort will improve 07/26/2019 1705 by Orvan Falconer, RN Outcome: Progressing 07/26/2019 1152 by Orvan Falconer, RN Outcome: Progressing   Problem: Safety: Goal: Ability to remain free from injury will improve 07/26/2019 1705 by Orvan Falconer, RN Outcome: Progressing 07/26/2019 1152 by Orvan Falconer, RN Outcome: Progressing   Problem: Skin Integrity: Goal: Risk for impaired skin integrity will decrease 07/26/2019 1705 by Orvan Falconer, RN Outcome: Progressing 07/26/2019 1152 by Orvan Falconer, RN Outcome: Progressing

## 2019-07-26 NOTE — Progress Notes (Signed)
PROGRESS NOTE    Randy Lee  [id number] DOB: [birth year] DOA: 07/19/2019 PCP: Monico Blitz, MD   Brief Narrative:  Randy Lee 68 y.o.malewith medical history significant fordiabetes mellitus, HTN, blindness.Patient was brought to the ED by spouse with reports of cough difficulty breathing fever and generalized weakness. Patient tested positive for Covid this past Friday- 11/27.With worsening difficulty breathing, patient presented to the ED.  Assessment & Plan:   Active Problems:   Pneumonia due to COVID-19 virus  1. Acute Hypoxic Resp. Failure due to Acute Covid 19 Viral Pneumonitis Recent O2 needs fluctuating, currently on 2 L  Continue steroids (continue, day 8 today) and remdesivir - completed course 12/4 Strict I/O, daily weights Daily inflammatory markers Prone as able, OOB, IS  C/o CP with cough and abd pain with cough will adjust cough meds 12/3 CXR with bilateral lung opacities with worsening in L lung CXR 12/5 with stable patchy opacities   COVID-19 Labs  Recent Labs    07/24/19 0142 07/25/19 0301 07/26/19 0154  DDIMER 0.40 0.36 0.33  FERRITIN  --  335 354*  CRP 1.6* 0.8 0.7    No results found for: [place]  2.  Legal blindness.  noted.   3.  Hypertension.  In poor control, increase Norvasc dose, continue home dose of ACE inhibitor and HCTZ and monitor.  4.  BPH.  On Flomax continue.    5.  Nonspecific single set high-sensitivity troponin obtained in ER at [facility] for reasons unclear.  Mildly elevated troponin at 21.  No chest pain documented, no EKG, patient symptom-free.  Monitor clinically.  Not an ACS pattern. Follow EKG here - sinus brady  6. DM2 - On 70/30 twice daily along with sliding scale (increase AM dose).  Follow closely for hypoglycemia.  Mealtime insulin added on top of this.  Will monitor and adjust closely.  A1c is 7.3.  Hx Prostate Ca: will need outpatient follow up for this.  Wife discussed this with  me and noted he's supposed to follow up with someone in [place] soon for pending results.  Noted he'll need to follow up outpatient regarding.  Elevated Creatinine: follow with ACE and thiazide - fluctuating, follow  DVT prophylaxis: lovenox Code Status: full  Family Communication: none at bedside - discussed with wife Disposition Plan: pending further improvement  Consultants:   none  Procedures:   none  Antimicrobials:  Anti-infectives (From admission, onward)   Start     Dose/Rate Route Frequency Ordered Stop   07/21/19 1000  remdesivir 100 mg in sodium chloride 0.9 % 250 mL IVPB     100 mg 500 mL/hr over 30 Minutes Intravenous Daily 07/20/19 1547 07/23/19 1049   07/20/19 1600  remdesivir 100 mg in sodium chloride 0.9 % 250 mL IVPB  Status:  Discontinued     100 mg 500 mL/hr over 30 Minutes Intravenous Daily 07/20/19 0135 07/20/19 1547   07/19/19 2145  remdesivir 200 mg in sodium chloride 0.9 % 250 mL IVPB     200 mg 500 mL/hr over 30 Minutes Intravenous Once 07/19/19 2140 07/19/19 2326     Subjective: Persisetnt cough, no new complaints  Objective: Vitals:   07/26/19 0427 07/26/19 0429 07/26/19 0730 07/26/19 1538  BP: 116/80  124/79   Pulse: (!) 49  (!) 50   Resp: 18  15   Temp: (!) 97.5 F (36.4 C)  97.9 F (36.6 C) 97.6 F (36.4 C)  TempSrc: Oral  Axillary Oral  SpO2:  99%  99%   Weight:  81 kg    Height:        Intake/Output Summary (Last 24 hours) at 07/26/2019 1750 Last data filed at 07/26/2019 1448 Gross per 24 hour  Intake 420 ml  Output 1275 ml  Net -855 ml   Filed Weights   07/24/19 0500 07/25/19 0452 07/26/19 0429  Weight: 80.3 kg 81.3 kg 81 kg    Examination:  General: No acute distress. Cardiovascular: RRR Lungs: unalbored, cough Abdomen: Soft, nontender, nondistended Neurological: Alert and oriented 3. Moves all extremities 4 . Cranial nerves II through XII grossly intact. Skin: Warm and dry. No rashes or lesions. Extremities: No  clubbing or cyanosis. No edema.   Data Reviewed: I have personally reviewed following labs and imaging studies  CBC: Recent Labs  Lab 07/22/19 0605 07/23/19 0000 07/24/19 0142 07/25/19 0301 07/26/19 0154  WBC 12.8* 13.5* 12.2* 12.9* 13.9*  NEUTROABS 11.3* 11.5* 10.1* 10.3* 10.9*  HGB 14.2 13.7 13.7 14.1 13.9  HCT 41.7 41.0 40.2 41.8 41.1  MCV 87.6 88.4 86.6 88.0 88.2  PLT 248 342 342 387 [id number]*   Basic Metabolic Panel: Recent Labs  Lab 07/22/19 0605 07/23/19 0000 07/24/19 0142 07/25/19 0301 07/26/19 0154  NA 131* 136 132* 136 134*  K 3.4* 4.5 4.1 4.4 4.1  CL 97* 100 96* 98 95*  CO2 22 25 23 25 23   GLUCOSE 165* 133* 243* 166* 193*  BUN 44* 50* 41* 41* 46*  CREATININE 1.13 1.25* 1.17 1.30* 1.25*  CALCIUM 9.6 10.1 9.7 10.2 10.2  MG 2.1 2.4 2.2 2.2 2.1   GFR: Estimated Creatinine Clearance: 55.8 mL/min ([name]) (by C-G formula based on SCr of 1.25 mg/dL (H)). Liver Function Tests: Recent Labs  Lab 07/22/19 0605 07/23/19 0000 07/24/19 0142 07/25/19 0301 07/26/19 0154  AST 22 19 17 15  14*  ALT 32 30 24 23 23   ALKPHOS 43 43 42 43 43  BILITOT 0.8 0.6 0.5 0.7 0.5  PROT 7.4 7.2 6.6 7.1 6.9  ALBUMIN 3.1* 3.2* 3.0* 3.1* 3.0*   No results for input(s): LIPASE, AMYLASE in the last 168 hours. No results for input(s): AMMONIA in the last 168 hours. Coagulation Profile: No results for input(s): INR, PROTIME in the last 168 hours. Cardiac Enzymes: No results for input(s): CKTOTAL, CKMB, CKMBINDEX, TROPONINI in the last 168 hours. BNP (last 3 results) No results for input(s): PROBNP in the last 8760 hours. HbA1C: No results for input(s): HGBA1C in the last 72 hours. CBG: Recent Labs  Lab 07/25/19 1622 07/25/19 2028 07/26/19 0736 07/26/19 1123 07/26/19 1536  GLUCAP 216* 271* 135* 298* 312*   Lipid Profile: No results for input(s): CHOL, HDL, LDLCALC, TRIG, CHOLHDL, LDLDIRECT in the last 72 hours. Thyroid Function Tests: No results for input(s): TSH, T4TOTAL, FREET4,  T3FREE, THYROIDAB in the last 72 hours. Anemia Panel: Recent Labs    07/25/19 0301 07/26/19 0154  FERRITIN 335 354*   Sepsis Labs: Recent Labs  Lab 07/19/19 1903 07/23/19 1620 07/24/19 0142 07/25/19 0301  PROCALCITON  --  <0.10 <0.10 <0.10  LATICACIDVEN 1.2  --   --   --     Recent Results (from the past 240 hour(s))  Blood Culture (routine x 2)     Status: None   Collection Time: 07/19/19  4:27 PM   Specimen: Right Antecubital; Blood  Result Value Ref Range Status   Specimen Description RIGHT ANTECUBITAL  Final   Special Requests   Final    BOTTLES DRAWN  AEROBIC AND ANAEROBIC Blood Culture adequate volume   Culture   Final    NO GROWTH 5 DAYS Performed at [facility], [address] [place], [place] [address]    Report Status 07/24/2019 FINAL  Final  Blood Culture (routine x 2)     Status: None   Collection Time: 07/19/19  4:28 PM   Specimen: Left Antecubital; Blood  Result Value Ref Range Status   Specimen Description LEFT ANTECUBITAL  Final   Special Requests   Final    BOTTLES DRAWN AEROBIC AND ANAEROBIC Blood Culture adequate volume   Culture   Final    NO GROWTH 5 DAYS Performed at [facility], [address], [place], [place] [address]    Report Status 07/24/2019 FINAL  Final         Radiology Studies: No results found.      Scheduled Meds: . amLODipine  10 mg Oral Daily  . aspirin EC  81 mg Oral q morning - 10a  . canagliflozin  100 mg Oral QAC breakfast  . dexamethasone (DECADRON) injection  6 mg Intravenous Q24H  . dextromethorphan-guaiFENesin  1 tablet Oral BID  . enoxaparin (LOVENOX) injection  40 mg Subcutaneous Q24H  . finasteride  5 mg Oral q morning - 10a  . lisinopril  20 mg Oral BID   And  . hydrochlorothiazide  25 mg Oral BID  . insulin aspart  0-15 Units Subcutaneous TID WC  . insulin aspart  0-5 Units Subcutaneous QHS  . insulin aspart protamine- aspart  20 Units Subcutaneous QAC supper  . insulin aspart protamine- aspart   35 Units Subcutaneous QAC breakfast  . oxybutynin  5 mg Oral QPM  . polyethylene glycol  17 g Oral BID  . pravastatin  10 mg Oral QHS  . prednisoLONE acetate  1 drop Right Eye BID  . sodium chloride flush  3 mL Intravenous Q12H  . tamsulosin  0.4 mg Oral Daily  . timolol  1 drop Right Eye BID   Continuous Infusions:    LOS: 7 days    Time spent: over 30 min    Fayrene Helper, MD [facility] Pager AMION  If 7PM-7AM, please contact night-coverage [email or web address] Password [email or web address] 07/26/2019, 5:50 PM

## 2019-07-26 NOTE — Progress Notes (Signed)
[place] called and informed RN pt has inverted p waves on his EKG. Pt's vitals are within normal limits, pt is sleeping and stable. MD notified.

## 2019-07-27 LAB — COMPREHENSIVE METABOLIC PANEL
ALT: 22 U/L (ref 0–44)
AST: 15 U/L (ref 15–41)
Albumin: 3.2 g/dL — ABNORMAL LOW (ref 3.5–5.0)
Alkaline Phosphatase: 40 U/L (ref 38–126)
Anion gap: 13 (ref 5–15)
BUN: 47 mg/dL — ABNORMAL HIGH (ref 8–23)
CO2: 25 mmol/L (ref 22–32)
Calcium: 10.2 mg/dL (ref 8.9–10.3)
Chloride: 97 mmol/L — ABNORMAL LOW (ref 98–111)
Creatinine, Ser: 1.1 mg/dL (ref 0.61–1.24)
GFR calc Af Amer: >60 mL/min (ref >60)
GFR calc non Af Amer: >60 mL/min (ref >60)
Glucose, Bld: 83 mg/dL (ref 70–99)
Potassium: 3.9 mmol/L (ref 3.5–5.1)
Sodium: 135 mmol/L (ref 135–145)
Total Bilirubin: 0.7 mg/dL (ref 0.3–1.2)
Total Protein: 7.1 g/dL (ref 6.5–8.1)

## 2019-07-27 LAB — CBC WITH DIFFERENTIAL/PLATELET
Abs Immature Granulocytes: 0.68 10*3/uL — ABNORMAL HIGH (ref 0.00–0.07)
Basophils Absolute: 0.1 10*3/uL (ref 0.0–0.1)
Basophils Relative: 1 %
Eosinophils Absolute: 0.1 10*3/uL (ref 0.0–0.5)
Eosinophils Relative: 0 %
HCT: 42.1 % (ref 39.0–52.0)
Hemoglobin: 14 g/dL (ref 13.0–17.0)
Immature Granulocytes: 5 %
Lymphocytes Relative: 12 %
Lymphs Abs: 1.7 10*3/uL (ref 0.7–4.0)
MCH: 29.5 pg (ref 26.0–34.0)
MCHC: 33.3 g/dL (ref 30.0–36.0)
MCV: 88.8 fL (ref 80.0–100.0)
Monocytes Absolute: 1.2 10*3/uL — ABNORMAL HIGH (ref 0.1–1.0)
Monocytes Relative: 9 %
Neutro Abs: 10.1 10*3/uL — ABNORMAL HIGH (ref 1.7–7.7)
Neutrophils Relative %: 73 %
Platelets: 421 10*3/uL — ABNORMAL HIGH (ref 150–400)
RBC: 4.74 MIL/uL (ref 4.22–5.81)
RDW: 12.4 % (ref 11.5–15.5)
WBC: 13.7 10*3/uL — ABNORMAL HIGH (ref 4.0–10.5)
nRBC: 0 % (ref 0.0–0.2)

## 2019-07-27 LAB — GLUCOSE, CAPILLARY
Glucose-Capillary: 86 mg/dL (ref 70–99)
Glucose-Capillary: 191 mg/dL — ABNORMAL HIGH (ref 70–99)
Glucose-Capillary: 237 mg/dL — ABNORMAL HIGH (ref 70–99)
Glucose-Capillary: 235 mg/dL — ABNORMAL HIGH (ref 70–99)
Glucose-Capillary: 179 mg/dL — ABNORMAL HIGH (ref 70–99)

## 2019-07-27 LAB — MAGNESIUM: Magnesium: 2.5 mg/dL — ABNORMAL HIGH (ref 1.7–2.4)

## 2019-07-27 LAB — FERRITIN: Ferritin: 326 ng/mL (ref 24–336)

## 2019-07-27 LAB — D-DIMER, QUANTITATIVE: D-Dimer, Quant: 0.29 ug/mL-FEU (ref 0.00–0.50)

## 2019-07-27 LAB — C-REACTIVE PROTEIN: CRP: 0.6 mg/dL (ref <1.0)

## 2019-07-27 NOTE — Plan of Care (Signed)

## 2019-07-27 NOTE — Plan of Care (Signed)
Pt is currently sitting up on the chair. No complaints of pain or shortness of breath. Wife, Caren Griffins, was updated on patient's plan of care.   Problem: Education: Goal: Knowledge of General Education information will improve Description: Including pain rating scale, medication(s)/side effects and non-pharmacologic comfort measures Outcome: Progressing   Problem: Health Behavior/Discharge Planning: Goal: Ability to manage health-related needs will improve Outcome: Progressing   Problem: Clinical Measurements: Goal: Ability to maintain clinical measurements within normal limits will improve Outcome: Progressing Goal: Will remain free from infection Outcome: Progressing Goal: Diagnostic test results will improve Outcome: Progressing Goal: Respiratory complications will improve Outcome: Progressing Goal: Cardiovascular complication will be avoided Outcome: Progressing   Problem: Activity: Goal: Risk for activity intolerance will decrease Outcome: Progressing   Problem: Nutrition: Goal: Adequate nutrition will be maintained Outcome: Progressing   Problem: Coping: Goal: Level of anxiety will decrease Outcome: Progressing   Problem: Elimination: Goal: Will not experience complications related to bowel motility Outcome: Progressing Goal: Will not experience complications related to urinary retention Outcome: Progressing   Problem: Pain Managment: Goal: General experience of comfort will improve Outcome: Progressing   Problem: Safety: Goal: Ability to remain free from injury will improve Outcome: Progressing   Problem: Skin Integrity: Goal: Risk for impaired skin integrity will decrease Outcome: Progressing

## 2019-07-27 NOTE — Progress Notes (Signed)
PROGRESS NOTE    Randy Lee  [id number] DOB: 03-09-1951 DOA: 07/19/2019 PCP: Monico Blitz, MD   Brief Narrative:  Randy Lee 68 y.o.malewith medical history significant fordiabetes mellitus, HTN, blindness.Patient was brought to the ED by spouse with reports of cough difficulty breathing fever and generalized weakness. Patient tested positive for Covid this past Friday- 11/27.With worsening difficulty breathing, patient presented to the ED.  He was admitted for COVID 19 pneumonia.  Currently still requiring 2 L supplemental O2.  Hopeful for discharge to SNF soon.  Assessment & Plan:   Active Problems:   Pneumonia due to COVID-19 virus  1. Acute Hypoxic Resp. Failure due to Acute Covid 19 Viral Pneumonitis Recent O2 needs fluctuating, currently on 2 L - wean as tolerated - home O2 screen Continue steroids (continue, day 9 today) and remdesivir - completed course 12/4 Strict I/O, daily weights Daily inflammatory markers Prone as able, OOB, IS  C/o CP with cough and abd pain with cough will adjust cough meds 12/3 CXR with bilateral lung opacities with worsening in L lung CXR 12/5 with stable patchy opacities   COVID-19 Labs  Recent Labs    07/25/19 0301 07/26/19 0154 07/27/19 0344  DDIMER 0.36 0.33 0.29  FERRITIN 335 354* 326  CRP 0.8 0.7 0.6    No results found for: SARSCOV2NAA  2.  Legal blindness.  noted.   3.  Hypertension.  In poor control, increase Norvasc dose, continue home dose of ACE inhibitor and HCTZ and monitor.  4.  BPH.  On Flomax continue.    5.  Nonspecific single set high-sensitivity troponin obtained in ER at [facility] for reasons unclear.  Mildly elevated troponin at 21.  No chest pain documented, no EKG, patient symptom-free.  Monitor clinically.  Not an ACS pattern. Follow EKG here - sinus brady  6. DM2 - On 70/30 twice daily along with sliding scale (increase AM dose).  Follow closely for hypoglycemia.  Mealtime  insulin added on top of this.  Will monitor and adjust closely.  A1c is 7.3.  Hx Prostate Ca: will need outpatient follow up for this.  Wife discussed this with me and noted he's supposed to follow up with someone in [place] soon for pending results.  Noted he'll need to follow up outpatient regarding.  Elevated Creatinine: follow with ACE and thiazide - fluctuating, follow  DVT prophylaxis: lovenox Code Status: full  Family Communication: none at bedside - discussed with wife Disposition Plan: pending further improvement  Consultants:   none  Procedures:   none  Antimicrobials:  Anti-infectives (From admission, onward)   Start     Dose/Rate Route Frequency Ordered Stop   07/21/19 1000  remdesivir 100 mg in sodium chloride 0.9 % 250 mL IVPB     100 mg 500 mL/hr over 30 Minutes Intravenous Daily 07/20/19 1547 07/23/19 1049   07/20/19 1600  remdesivir 100 mg in sodium chloride 0.9 % 250 mL IVPB  Status:  Discontinued     100 mg 500 mL/hr over 30 Minutes Intravenous Daily 07/20/19 0135 07/20/19 1547   07/19/19 2145  remdesivir 200 mg in sodium chloride 0.9 % 250 mL IVPB     200 mg 500 mL/hr over 30 Minutes Intravenous Once 07/19/19 2140 07/19/19 2326     Subjective: Cough improved, CP better  Objective: Vitals:   07/27/19 0400 07/27/19 0456 07/27/19 0730 07/27/19 1100  BP: 133/83  106/86   Pulse: (!) 53  (!) 49 (!) 55  Resp:  14  16 17   Temp: 97.7 F (36.5 C)  97.6 F (36.4 C)   TempSrc: Oral  Oral   SpO2:   92% 91%  Weight:  81 kg  79.7 kg  Height:        Intake/Output Summary (Last 24 hours) at 07/27/2019 1418 Last data filed at 07/27/2019 0950 Gross per 24 hour  Intake 860 ml  Output 1350 ml  Net -490 ml   Filed Weights   07/26/19 0429 07/27/19 0456 07/27/19 1100  Weight: 81 kg 81 kg 79.7 kg    Examination:  General: No acute distress. Cardiovascular: RRR Lungs: unlabored Abdomen: Soft, nontender, nondistended  Neurological: Alert and oriented 3.  Moves all extremities 4. Cranial nerves II through XII grossly intact. Skin: Warm and dry. No rashes or lesions. Extremities: No clubbing or cyanosis. No edema.   Data Reviewed: I have personally reviewed following labs and imaging studies  CBC: Recent Labs  Lab 07/23/19 0000 07/24/19 0142 07/25/19 0301 07/26/19 0154 07/27/19 0344  WBC 13.5* 12.2* 12.9* 13.9* 13.7*  NEUTROABS 11.5* 10.1* 10.3* 10.9* 10.1*  HGB 13.7 13.7 14.1 13.9 14.0  HCT 41.0 40.2 41.8 41.1 42.1  MCV 88.4 86.6 88.0 88.2 88.8  PLT 342 342 387 411* [id number]*   Basic Metabolic Panel: Recent Labs  Lab 07/23/19 0000 07/24/19 0142 07/25/19 0301 07/26/19 0154 07/27/19 0344  NA 136 132* 136 134* 135  K 4.5 4.1 4.4 4.1 3.9  CL 100 96* 98 95* 97*  CO2 25 23 25 23 25   GLUCOSE 133* 243* 166* 193* 83  BUN 50* 41* 41* 46* 47*  CREATININE 1.25* 1.17 1.30* 1.25* 1.10  CALCIUM 10.1 9.7 10.2 10.2 10.2  MG 2.4 2.2 2.2 2.1 2.5*   GFR: Estimated Creatinine Clearance: 63.4 mL/min (by C-G formula based on SCr of 1.1 mg/dL). Liver Function Tests: Recent Labs  Lab 07/23/19 0000 07/24/19 0142 07/25/19 0301 07/26/19 0154 07/27/19 0344  AST 19 17 15  14* 15  ALT 30 24 23 23 22   ALKPHOS 43 42 43 43 40  BILITOT 0.6 0.5 0.7 0.5 0.7  PROT 7.2 6.6 7.1 6.9 7.1  ALBUMIN 3.2* 3.0* 3.1* 3.0* 3.2*   No results for input(s): LIPASE, AMYLASE in the last 168 hours. No results for input(s): AMMONIA in the last 168 hours. Coagulation Profile: No results for input(s): INR, PROTIME in the last 168 hours. Cardiac Enzymes: No results for input(s): CKTOTAL, CKMB, CKMBINDEX, TROPONINI in the last 168 hours. BNP (last 3 results) No results for input(s): PROBNP in the last 8760 hours. HbA1C: No results for input(s): HGBA1C in the last 72 hours. CBG: Recent Labs  Lab 07/26/19 1123 07/26/19 1536 07/26/19 2026 07/27/19 0755 07/27/19 1131  GLUCAP 298* 312* 295* 86 191*   Lipid Profile: No results for input(s): CHOL, HDL, LDLCALC,  TRIG, CHOLHDL, LDLDIRECT in the last 72 hours. Thyroid Function Tests: No results for input(s): TSH, T4TOTAL, FREET4, T3FREE, THYROIDAB in the last 72 hours. Anemia Panel: Recent Labs    07/26/19 0154 07/27/19 0344  FERRITIN 354* 326   Sepsis Labs: Recent Labs  Lab 07/23/19 1620 07/24/19 0142 07/25/19 0301  PROCALCITON <0.10 <0.10 <0.10    Recent Results (from the past 240 hour(s))  Blood Culture (routine x 2)     Status: None   Collection Time: 07/19/19  4:27 PM   Specimen: Right Antecubital; Blood  Result Value Ref Range Status   Specimen Description RIGHT ANTECUBITAL  Final   Special Requests   Final  BOTTLES DRAWN AEROBIC AND ANAEROBIC Blood Culture adequate volume   Culture   Final    NO GROWTH 5 DAYS Performed at [facility], [address] [place], [place] [address]    Report Status 07/24/2019 FINAL  Final  Blood Culture (routine x 2)     Status: None   Collection Time: 07/19/19  4:28 PM   Specimen: Left Antecubital; Blood  Result Value Ref Range Status   Specimen Description LEFT ANTECUBITAL  Final   Special Requests   Final    BOTTLES DRAWN AEROBIC AND ANAEROBIC Blood Culture adequate volume   Culture   Final    NO GROWTH 5 DAYS Performed at [facility], [address], [place], [place] [address]    Report Status 07/24/2019 FINAL  Final         Radiology Studies: No results found.      Scheduled Meds: . amLODipine  10 mg Oral Daily  . aspirin EC  81 mg Oral q morning - 10a  . canagliflozin  100 mg Oral QAC breakfast  . dexamethasone (DECADRON) injection  6 mg Intravenous Q24H  . dextromethorphan-guaiFENesin  1 tablet Oral BID  . enoxaparin (LOVENOX) injection  40 mg Subcutaneous Q24H  . finasteride  5 mg Oral q morning - 10a  . lisinopril  20 mg Oral BID   And  . hydrochlorothiazide  25 mg Oral BID  . insulin aspart  0-15 Units Subcutaneous TID WC  . insulin aspart  0-5 Units Subcutaneous QHS  . insulin aspart protamine- aspart  20  Units Subcutaneous QAC supper  . insulin aspart protamine- aspart  35 Units Subcutaneous QAC breakfast  . oxybutynin  5 mg Oral QPM  . polyethylene glycol  17 g Oral BID  . pravastatin  10 mg Oral QHS  . prednisoLONE acetate  1 drop Right Eye BID  . sodium chloride flush  3 mL Intravenous Q12H  . tamsulosin  0.4 mg Oral Daily  . timolol  1 drop Right Eye BID   Continuous Infusions:    LOS: 8 days    Time spent: over 30 min    Fayrene Helper, MD [facility] Pager [email or web address]  If 7PM-7AM, please contact night-coverage [email or web address] Password [email or web address] 07/27/2019, 2:18 PM

## 2019-07-27 NOTE — Progress Notes (Signed)
Occupational Therapy Treatment Patient Details Name: Randy Lee MRN: [id number] DOB: October 08, 1950 Today's Date: 07/27/2019    History of present illness 68 y/o man w/ hx of pruritic disorder, HTN, DM II, hepatitis, esophageal reflux, dyspnea, chets pain, cancer, blindness, acute pharyngitis. cervial spine surgery, liver biopsy, wound debrodement, cornea transplant x 2. pt presented to ED w/ SOB was dx with COVID 11/27   OT comments  Pt progressing towards established OT goals. Pt performing grooming at sink with Supervision for safety. Pt SpO2 dropping to 85% on RA during grooming; cued for purse lip breathing and pt able to recover to 90%. Pt performing functional mobility in hallway with Min single hand held A. Pt SpO2 dropping to low 80s and he required 2L O2, standing rest break, and cues for purse lip breathing to recover to 90%. Continue to recommend dc to post-acute rehab and will continue to follow acutely as admitted.    Follow Up Recommendations  SNF;Home health OT;Supervision/Assistance - 24 hour(pending progress)    Equipment Recommendations  Other (comment)(home O2; RW)    Recommendations for Other Services      Precautions / Restrictions Precautions Precautions: Fall Precaution Comments: Legally blind Restrictions Weight Bearing Restrictions: No       Mobility Bed Mobility               General bed mobility comments: OOB in chair  Transfers Overall transfer level: Needs assistance Equipment used: None Transfers: Sit to/from Stand Sit to Stand: Supervision         General transfer comment: Supervision for safety. Single hand held A for mobility as pt uses a cane at baseline    Balance Overall balance assessment: Mild deficits observed, not formally tested                                         ADL either performed or assessed with clinical judgement   ADL Overall ADL's : Needs assistance/impaired     Grooming:  Supervision/safety;Standing;Set up;Oral care;Wash/dry face Grooming Details (indicate cue type and reason): Cues for visual deficits. Supervision for safety                             Functional mobility during ADLs: Minimal assistance(Singe hand held A) General ADL Comments: Pt performing grooming at sink and then performing functional mobility in hallway. Spo2 dropping to 87% on RA during grooming. Pt able to return to 90s with cues for purse lip breathing. During functional mobility, pt dropping to low 80s on RA; requiring 2L O2 and standing rest break to return to 90%.      Vision       Perception     Praxis      Cognition Arousal/Alertness: Awake/alert Behavior During Therapy: WFL for tasks assessed/performed Overall Cognitive Status: Within Functional Limits for tasks assessed                                 General Comments: endorses memory deficits at baseline        Exercises     Shoulder Instructions       General Comments SpO2 dropping to low 80s on RA during mobility. Requiring standing rest break, purse lip breathing, and 2L O2 for elevating back to 90s  Pertinent Vitals/ Pain       Pain Assessment: Faces Faces Pain Scale: Hurts little more Pain Location: w/ movement pain at foley site Pain Intervention(s): Monitored during session;Limited activity within patient's tolerance;Repositioned  Home Living                                          Prior Functioning/Environment              Frequency  Min 3X/week        Progress Toward Goals  OT Goals(current goals can now be found in the care plan section)  Progress towards OT goals: Progressing toward goals  Acute Rehab OT Goals Patient Stated Goal: to get better OT Goal Formulation: With patient Time For Goal Achievement: 08/04/19 Potential to Achieve Goals: Good ADL Goals Pt Will Perform Lower Body Bathing: with modified independence;sit to/from  stand Pt Will Perform Lower Body Dressing: with modified independence;sit to/from stand Pt Will Transfer to Toilet: with modified independence;ambulating Pt Will Perform Toileting - Clothing Manipulation and hygiene: with modified independence;sit to/from stand Additional ADL Goal #1: Pt will independently identify 3 strategies to reduce risk of falls  Plan Discharge plan remains appropriate    Co-evaluation                 AM-PAC OT "6 Clicks" Daily Activity     Outcome Measure   Help from another person eating meals?: None Help from another person taking care of personal grooming?: A Little Help from another person toileting, which includes using toliet, bedpan, or urinal?: A Little Help from another person bathing (including washing, rinsing, drying)?: A Little Help from another person to put on and taking off regular upper body clothing?: A Little Help from another person to put on and taking off regular lower body clothing?: A Little 6 Click Score: 19    End of Session Equipment Utilized During Treatment: Oxygen(2L)  OT Visit Diagnosis: Unsteadiness on feet (R26.81);Muscle weakness (generalized) (M62.81)   Activity Tolerance Patient tolerated treatment well   Patient Left in chair;with call bell/phone within reach   Nurse Communication Mobility status       Time: [id number] OT Time Calculation (min): 24 min  Charges: OT General Charges $OT Visit: 1 Visit OT Treatments $Self Care/Home Management : 23-37 mins  Lupus, OTR/L Acute Rehab Pager: [phone] Office: [place] 07/27/2019, 5:40 PM

## 2019-07-28 DIAGNOSIS — U071 COVID-19: Principal | ICD-10-CM

## 2019-07-28 DIAGNOSIS — J1289 Other viral pneumonia: Secondary | ICD-10-CM

## 2019-07-28 LAB — COMPREHENSIVE METABOLIC PANEL
ALT: 23 U/L (ref 0–44)
AST: 15 U/L (ref 15–41)
Albumin: 3.1 g/dL — ABNORMAL LOW (ref 3.5–5.0)
Alkaline Phosphatase: 42 U/L (ref 38–126)
Anion gap: 11 (ref 5–15)
BUN: 45 mg/dL — ABNORMAL HIGH (ref 8–23)
CO2: 28 mmol/L (ref 22–32)
Calcium: 10.1 mg/dL (ref 8.9–10.3)
Chloride: 95 mmol/L — ABNORMAL LOW (ref 98–111)
Creatinine, Ser: 1.24 mg/dL (ref 0.61–1.24)
GFR calc Af Amer: >60 mL/min (ref >60)
GFR calc non Af Amer: 59 mL/min — ABNORMAL LOW (ref >60)
Glucose, Bld: 140 mg/dL — ABNORMAL HIGH (ref 70–99)
Potassium: 4.1 mmol/L (ref 3.5–5.1)
Sodium: 134 mmol/L — ABNORMAL LOW (ref 135–145)
Total Bilirubin: 0.7 mg/dL (ref 0.3–1.2)
Total Protein: 7.1 g/dL (ref 6.5–8.1)

## 2019-07-28 LAB — CBC WITH DIFFERENTIAL/PLATELET
Abs Immature Granulocytes: 0.72 10*3/uL — ABNORMAL HIGH (ref 0.00–0.07)
Basophils Absolute: 0.1 10*3/uL (ref 0.0–0.1)
Basophils Relative: 0 %
Eosinophils Absolute: 0 10*3/uL (ref 0.0–0.5)
Eosinophils Relative: 0 %
HCT: 42.6 % (ref 39.0–52.0)
Hemoglobin: 14.2 g/dL (ref 13.0–17.0)
Immature Granulocytes: 5 %
Lymphocytes Relative: 11 %
Lymphs Abs: 1.6 10*3/uL (ref 0.7–4.0)
MCH: 29.4 pg (ref 26.0–34.0)
MCHC: 33.3 g/dL (ref 30.0–36.0)
MCV: 88.2 fL (ref 80.0–100.0)
Monocytes Absolute: 1.1 10*3/uL — ABNORMAL HIGH (ref 0.1–1.0)
Monocytes Relative: 8 %
Neutro Abs: 10.6 10*3/uL — ABNORMAL HIGH (ref 1.7–7.7)
Neutrophils Relative %: 76 %
Platelets: 388 10*3/uL (ref 150–400)
RBC: 4.83 MIL/uL (ref 4.22–5.81)
RDW: 12.3 % (ref 11.5–15.5)
WBC: 14.1 10*3/uL — ABNORMAL HIGH (ref 4.0–10.5)
nRBC: 0 % (ref 0.0–0.2)

## 2019-07-28 LAB — C-REACTIVE PROTEIN: CRP: 0.7 mg/dL (ref <1.0)

## 2019-07-28 LAB — GLUCOSE, CAPILLARY
Glucose-Capillary: 172 mg/dL — ABNORMAL HIGH (ref 70–99)
Glucose-Capillary: 327 mg/dL — ABNORMAL HIGH (ref 70–99)
Glucose-Capillary: 357 mg/dL — ABNORMAL HIGH (ref 70–99)
Glucose-Capillary: 188 mg/dL — ABNORMAL HIGH (ref 70–99)

## 2019-07-28 LAB — D-DIMER, QUANTITATIVE: D-Dimer, Quant: 0.29 ug/mL-FEU (ref 0.00–0.50)

## 2019-07-28 LAB — MAGNESIUM: Magnesium: 2.2 mg/dL (ref 1.7–2.4)

## 2019-07-28 LAB — FERRITIN: Ferritin: 313 ng/mL (ref 24–336)

## 2019-07-28 NOTE — NC FL2 (Signed)
[place] LEVEL OF CARE SCREENING TOOL     IDENTIFICATION  Patient Name: Randy Lee Birthdate: October 19, 1950 Sex: male Admission Date (Current Location): 07/19/2019  [facility] and [place] Number:  Herbalist and Address:  [facility], [place] [address], [place], [place] [address]([employer])      Provider Number: [id number]  Attending Physician Name and Address:  Patrecia Pour, MD  Relative Name and Phone Number:       Current Level of Care: Hospital Recommended Level of Care: [place] Prior Approval Number:    Date Approved/Denied:   PASRR Number:    Discharge Plan: SNF    Current Diagnoses: Patient Active Problem List   Diagnosis Date Noted  . Pneumonia due to COVID-19 virus 07/19/2019    Orientation RESPIRATION BLADDER Height & Weight     Self, Time, Situation, Place  O2(1L [place]) Continent Weight: 184 lb 4.9 oz (83.6 kg) Height:  5\' 9"  (175.3 cm)  BEHAVIORAL SYMPTOMS/MOOD NEUROLOGICAL BOWEL NUTRITION STATUS      Continent    AMBULATORY STATUS COMMUNICATION OF NEEDS Skin   Limited Assist Verbally Normal                       Personal Care Assistance Level of Assistance  Bathing, Dressing Bathing Assistance: Limited assistance   Dressing Assistance: Limited assistance     Functional Limitations Info             SPECIAL CARE FACTORS FREQUENCY  PT (By licensed PT), OT (By licensed OT)                    Contractures Contractures Info: Not present    Additional Factors Info  Code Status Code Status Info: FULL CODE             Current Medications (07/28/2019):  This is the current hospital active medication list Current Facility-Administered Medications  Medication Dose Route Frequency Provider Last Rate Last Dose  . acetaminophen (TYLENOL) tablet 650 mg  650 mg Oral Q6H PRN Emokpae, Ejiroghene E, MD   650 mg at 07/25/19 2155  . albuterol (VENTOLIN HFA) 108 (90  Base) MCG/ACT inhaler 1-2 puff  1-2 puff Inhalation Q6H PRN Emokpae, Ejiroghene E, MD   1 puff at 07/20/19 0933  . amLODipine (NORVASC) tablet 10 mg  10 mg Oral Daily Thurnell Lose, MD   10 mg at 07/28/19 0949  . aspirin EC tablet 81 mg  81 mg Oral q morning - 10a Emokpae, Ejiroghene E, MD   81 mg at 07/28/19 0949  . canagliflozin (INVOKANA) tablet 100 mg  100 mg Oral QAC breakfast Emokpae, Ejiroghene E, MD   100 mg at 07/28/19 0831  . dextromethorphan-guaiFENesin (MUCINEX DM) 30-600 MG per 12 hr tablet 1 tablet  1 tablet Oral BID Emokpae, Ejiroghene E, MD   1 tablet at 07/28/19 0949  . enoxaparin (LOVENOX) injection 40 mg  40 mg Subcutaneous Q24H Emokpae, Ejiroghene E, MD   40 mg at 07/27/19 2347  . finasteride (PROSCAR) tablet 5 mg  5 mg Oral q morning - 10a Emokpae, Ejiroghene E, MD   5 mg at 07/28/19 0951  . guaiFENesin-codeine 100-10 MG/5ML solution 5 mL  5 mL Oral Q6H PRN Elodia Florence., MD   5 mL at 07/26/19 2122  . lisinopril (ZESTRIL) tablet 20 mg  20 mg Oral BID Emokpae, Ejiroghene E, MD   20  mg at 07/28/19 0949   And  . hydrochlorothiazide (HYDRODIURIL) tablet 25 mg  25 mg Oral BID Emokpae, Ejiroghene E, MD   25 mg at 07/28/19 0949  . insulin aspart (novoLOG) injection 0-15 Units  0-15 Units Subcutaneous TID WC Thurnell Lose, MD   3 Units at 07/28/19 0831  . insulin aspart (novoLOG) injection 0-5 Units  0-5 Units Subcutaneous QHS Thurnell Lose, MD   3 Units at 07/26/19 2117  . insulin aspart protamine- aspart (NOVOLOG MIX 70/30) injection 20 Units  20 Units Subcutaneous QAC supper Emokpae, Ejiroghene E, MD   20 Units at 07/27/19 1739  . insulin aspart protamine- aspart (NOVOLOG MIX 70/30) injection 35 Units  35 Units Subcutaneous QAC breakfast Elodia Florence., MD   35 Units at 07/28/19 [phone]  . ondansetron (ZOFRAN) tablet 4 mg  4 mg Oral Q6H PRN Emokpae, Ejiroghene E, MD       Or  . ondansetron (ZOFRAN) injection 4 mg  4 mg Intravenous Q6H PRN Emokpae, Ejiroghene  E, MD      . oxybutynin (DITROPAN) tablet 5 mg  5 mg Oral QPM Emokpae, Ejiroghene E, MD   5 mg at 07/27/19 1916  . polyethylene glycol (MIRALAX / GLYCOLAX) packet 17 g  17 g Oral Daily PRN Emokpae, Ejiroghene E, MD   17 g at 07/22/19 1000  . polyethylene glycol (MIRALAX / GLYCOLAX) packet 17 g  17 g Oral BID Elodia Florence., MD   17 g at 07/27/19 0940  . pravastatin (PRAVACHOL) tablet 10 mg  10 mg Oral QHS Emokpae, Ejiroghene E, MD   10 mg at 07/27/19 2155  . prednisoLONE acetate (PRED FORTE) 1 % ophthalmic suspension 1 drop  1 drop Right Eye BID Emokpae, Ejiroghene E, MD   1 drop at 07/28/19 0949  . sodium chloride flush (NS) 0.9 % injection 3 mL  3 mL Intravenous Q12H Emokpae, Ejiroghene E, MD   3 mL at 07/28/19 0959  . tamsulosin (FLOMAX) capsule 0.4 mg  0.4 mg Oral Daily Emokpae, Ejiroghene E, MD   0.4 mg at 07/28/19 0949  . timolol (TIMOPTIC) 0.5 % ophthalmic solution 1 drop  1 drop Right Eye BID Emokpae, Ejiroghene E, MD   1 drop at 07/28/19 [id number]     Discharge Medications: Please see discharge summary for a list of discharge medications.  Relevant Imaging Results:  Relevant Lab Results:   Additional Information SS# [id number]  Amador Cunas, [place]

## 2019-07-28 NOTE — Care Management Important Message (Signed)
Important Message  Patient Details  Name: Randy Lee MRN: [id number] Date of Birth: [birth year]   Medicare Important Message Given:  Yes - Important Message mailed due to current National Emergency  Verbal consent obtained due to current National Emergency  Relationship to patient: Spouse/Significant Other Contact Name: Nazim Safko Call Date: 07/28/19  Time: 1307 Phone: [phone] Outcome: Spoke with contact Important Message mailed to: Patient address on file    [place] 07/28/2019, 1:08 PM

## 2019-07-28 NOTE — Progress Notes (Signed)
Physical Therapy Treatment Patient Details Name: Randy Lee MRN: [id number] DOB: [birth year] Today's Date: 07/28/2019    History of Present Illness 68 y/o man w/ hx of pruritic disorder, HTN, DM II, hepatitis, esophageal reflux, dyspnea, chets pain, cancer, blindness, acute pharyngitis. cervial spine surgery, liver biopsy, wound debrodement, cornea transplant x 2. pt presented to ED w/ SOB was dx with COVID 11/27    PT Comments    The patient ambulated in Room to BR on RA, SPO2 85% x ~ 8 minutes on RA. Patient ambulated on 2 L with SPO2 >88%. Patient requires steady assist and visual cues. Patient  Should progress well with short term rehab.   Follow Up Recommendations  SNF     Equipment Recommendations  None recommended by PT    Recommendations for Other Services       Precautions / Restrictions Precautions Precautions: Fall Precaution Comments: Legally blind    Mobility  Bed Mobility   Bed Mobility: Supine to Sit     Supine to sit: Supervision        Transfers   Equipment used: None Transfers: Sit to/from Stand Sit to Stand: Supervision         General transfer comment: cues for safety  Ambulation/Gait Ambulation/Gait assistance: Min guard;Min assist Gait Distance (Feet): 20 Feet(x 2 then 125') Assistive device: 1 person hand held assist Gait Pattern/deviations: Step-through pattern;Drifts right/left Gait velocity: decreased   General Gait Details: ataxia in RLE, able to ambulate in hall but needs verbal cues for safety, ambulated on 2L/min via [place] desat to min 88% but able to quickly recover to 90s again, Ambulated in room  with cruiosing  on wall, objects. In hall, HHA of 1 for steady gait, requires at least 1 support.   Stairs             Wheelchair Mobility    Modified Rankin (Stroke Patients Only)       Balance                                            Cognition                                               Exercises      General Comments        Pertinent Vitals/Pain Pain Assessment: No/denies pain    Home Living                      Prior Function            PT Goals (current goals can now be found in the care plan section) Progress towards PT goals: Progressing toward goals    Frequency    Min 2X/week      PT Plan Current plan remains appropriate    Co-evaluation              AM-PAC PT "6 Clicks" Mobility   Outcome Measure  Help needed turning from your back to your side while in a flat bed without using bedrails?: None Help needed moving from lying on your back to sitting on the side of a flat bed without using bedrails?: None Help needed moving to and from a bed to a  chair (including a wheelchair)?: A Little Help needed standing up from a chair using your arms (e.g., wheelchair or bedside chair)?: A Little Help needed to walk in hospital room?: A Little Help needed climbing 3-5 steps with a railing? : A Lot 6 Click Score: 19    End of Session Equipment Utilized During Treatment: Oxygen;Gait belt Activity Tolerance: Patient tolerated treatment well Patient left: in chair;with call bell/phone within reach;with chair alarm set Nurse Communication: Mobility status PT Visit Diagnosis: Other abnormalities of gait and mobility (R26.89);Muscle weakness (generalized) (M62.81)     Time: [id number] PT Time Calculation (min) (ACUTE ONLY): 31 min  Charges:  $Gait Training: 23-37 mins                     [place] Pager [phone] Office [phone]    Claretha Cooper 07/28/2019, 12:26 PM

## 2019-07-28 NOTE — Social Work (Signed)
Per Gerald Stabs at [facility], no bed available until tomorrow. Spoke to [place] with [employer] who reports auth expires today and will need to start over. SW faxed request for new Josem Kaufmann [phone] per Larena Glassman request. Anticipate auth by tomorrow. PASRR received today [id number] A. MD updated.   Wandra Feinstein, MSW, LCSW [phone] (GV coverage)

## 2019-07-28 NOTE — Progress Notes (Signed)
PROGRESS NOTE  NIHAAL Lee  [id number] DOB: Nov 04, 1950 DOA: 07/19/2019 PCP: Monico Blitz, MD   Brief Narrative: Randy Lee a 68 y.o.malewith medical history significant fordiabetes mellitus,HTN, blindness.Patient was brought to the ED by spouse with reports of cough difficulty breathing fever and generalized weakness. Patient tested positive for Covid this past Friday- 11/27.With worsening difficulty breathing, patient presented to the ED.  He was admitted for COVID 19 pneumonia.  Currently still requiring 2 L supplemental O2.  Hopeful for discharge to SNF soon.  Assessment & Plan: Active Problems:   Pneumonia due to COVID-19 virus  Acute hypoxic respiratory failure due to covid-19 pneumonia and pneumonitis:  - Continue 2L O2, wean as tolerated. CXR 12/5 with stable patchy opacities - Has completed therapy of 5 days of remdesivir and 10 days of steroids. Inflammatory markers have long since normalized.  - Continue antitussives, IS, FV, OOB.    Legal blindness. noted.   Hypertension: -In poor control, increase Norvasc dose, continue home dose of ACE inhibitor and HCTZ and monitor.  BPH: - Flomax continue.   Troponin elevation: Not felt to be clinically salient in the absence of chest pain, ischemic ECG, or upward trend.   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 - On 70/30 twice daily along with sliding scale (increase AM dose). Follow closely for hypoglycemia.  Mealtime insulin added on top of this.  - Will monitor and adjust closely.   Hx Prostate CA: will need outpatient follow up for this.  Wife reports he's supposed to follow up with someone in [place] soon for pending results.   Elevated Creatinine, with stability the most likely diagnosis is stage IIIa CKD: follow with ACE and thiazide - fluctuating, follow  DVT prophylaxis: Lovenox Code Status: Full Family Communication: None at bedside Disposition Plan: SNF 12/10. No bed available today.    Consultants:   None  Procedures:   None  Antimicrobials:  Remdesivir   Subjective: Feels better, no pain or shortness of breath at rest. No other complaints.  Objective: Vitals:   07/28/19 0420 07/28/19 0500 07/28/19 0841 07/28/19 1513  BP: 119/67  116/76 91/67  Pulse: (!) 50  62 (!) 57  Resp: 18  18 20   Temp: 98 F (36.7 C)  98.2 F (36.8 C) 97.7 F (36.5 C)  TempSrc: Oral  Oral Oral  SpO2: 95%  98% 97%  Weight:  83.6 kg    Height:        Intake/Output Summary (Last 24 hours) at 07/28/2019 1709 Last data filed at 07/28/2019 1446 Gross per 24 hour  Intake 240 ml  Output 1420 ml  Net -1180 ml   Filed Weights   07/27/19 0456 07/27/19 1100 07/28/19 0500  Weight: 81 kg 79.7 kg 83.6 kg    Gen: 68 y.o. male in no distress  Pulm: Non-labored breathing 2L O2. Minimal crackles bilaterally.  CV: Regular rate and rhythm. No murmur, rub, or gallop. No JVD, no significant pedal edema. GI: Abdomen soft, non-tender, non-distended, with normoactive bowel sounds. No organomegaly or masses felt. Ext: Warm, no deformities Skin: No rashes, lesions or ulcers on visualized skin Neuro: Alert and oriented. No focal neurological deficits. Psych: Judgement and insight appear normal. Mood & affect appropriate.   Data Reviewed: I have personally reviewed following labs and imaging studies  CBC: Recent Labs  Lab 07/24/19 0142 07/25/19 0301 07/26/19 0154 07/27/19 0344 07/28/19 0430  WBC 12.2* 12.9* 13.9* 13.7* 14.1*  NEUTROABS 10.1* 10.3* 10.9* 10.1* 10.6*  HGB 13.7 14.1 13.9 14.0  14.2  HCT 40.2 41.8 41.1 42.1 42.6  MCV 86.6 88.0 88.2 88.8 88.2  PLT 342 387 411* 421* [id number]   Basic Metabolic Panel: Recent Labs  Lab 07/24/19 0142 07/25/19 0301 07/26/19 0154 07/27/19 0344 07/28/19 0430  NA 132* 136 134* 135 134*  K 4.1 4.4 4.1 3.9 4.1  CL 96* 98 95* 97* 95*  CO2 23 25 23 25 28   GLUCOSE 243* 166* 193* 83 140*  BUN 41* 41* 46* 47* 45*  CREATININE 1.17 1.30* 1.25* 1.10  1.24  CALCIUM 9.7 10.2 10.2 10.2 10.1  MG 2.2 2.2 2.1 2.5* 2.2   GFR: Estimated Creatinine Clearance: 56.2 mL/min (by C-G formula based on SCr of 1.24 mg/dL). Liver Function Tests: Recent Labs  Lab 07/24/19 0142 07/25/19 0301 07/26/19 0154 07/27/19 0344 07/28/19 0430  AST 17 15 14* 15 15  ALT 24 23 23 22 23   ALKPHOS 42 43 43 40 42  BILITOT 0.5 0.7 0.5 0.7 0.7  PROT 6.6 7.1 6.9 7.1 7.1  ALBUMIN 3.0* 3.1* 3.0* 3.2* 3.1*   No results for input(s): LIPASE, AMYLASE in the last 168 hours. No results for input(s): AMMONIA in the last 168 hours. Coagulation Profile: No results for input(s): INR, PROTIME in the last 168 hours. Cardiac Enzymes: No results for input(s): CKTOTAL, CKMB, CKMBINDEX, TROPONINI in the last 168 hours. BNP (last 3 results) No results for input(s): PROBNP in the last 8760 hours. HbA1C: No results for input(s): HGBA1C in the last 72 hours. CBG: Recent Labs  Lab 07/27/19 1656 07/27/19 2121 07/28/19 0818 07/28/19 1113 07/28/19 1517  GLUCAP 235* 179* 172* 327* 357*   Lipid Profile: No results for input(s): CHOL, HDL, LDLCALC, TRIG, CHOLHDL, LDLDIRECT in the last 72 hours. Thyroid Function Tests: No results for input(s): TSH, T4TOTAL, FREET4, T3FREE, THYROIDAB in the last 72 hours. Anemia Panel: Recent Labs    07/27/19 0344 07/28/19 0430  FERRITIN 326 313   Urine analysis:    Component Value Date/Time   COLORURINE YELLOW 01/20/2012 [place] 01/20/2012 1342   LABSPEC 1.020 01/20/2012 1342   PHURINE 6.0 01/20/2012 1342   GLUCOSEU NEGATIVE 01/20/2012 1342   HGBUR NEGATIVE 01/20/2012 1342   BILIRUBINUR NEGATIVE 01/20/2012 1342   KETONESUR NEGATIVE 01/20/2012 1342   PROTEINUR NEGATIVE 01/20/2012 1342   UROBILINOGEN 0.2 01/20/2012 1342   NITRITE NEGATIVE 01/20/2012 1342   LEUKOCYTESUR NEGATIVE 01/20/2012 1342   Recent Results (from the past 240 hour(s))  Blood Culture (routine x 2)     Status: None   Collection Time: 07/19/19   4:27 PM   Specimen: Right Antecubital; Blood  Result Value Ref Range Status   Specimen Description RIGHT ANTECUBITAL  Final   Special Requests   Final    BOTTLES DRAWN AEROBIC AND ANAEROBIC Blood Culture adequate volume   Culture   Final    NO GROWTH 5 DAYS Performed at [facility], [address], [place], [place] [address]    Report Status 07/24/2019 FINAL  Final  Blood Culture (routine x 2)     Status: None   Collection Time: 07/19/19  4:28 PM   Specimen: Left Antecubital; Blood  Result Value Ref Range Status   Specimen Description LEFT ANTECUBITAL  Final   Special Requests   Final    BOTTLES DRAWN AEROBIC AND ANAEROBIC Blood Culture adequate volume   Culture   Final    NO GROWTH 5 DAYS Performed at [facility], [address] [place], [place] [address]  Report Status 07/24/2019 FINAL  Final      Radiology Studies: No results found.  Scheduled Meds: . amLODipine  10 mg Oral Daily  . aspirin EC  81 mg Oral q morning - 10a  . canagliflozin  100 mg Oral QAC breakfast  . dextromethorphan-guaiFENesin  1 tablet Oral BID  . enoxaparin (LOVENOX) injection  40 mg Subcutaneous Q24H  . finasteride  5 mg Oral q morning - 10a  . lisinopril  20 mg Oral BID   And  . hydrochlorothiazide  25 mg Oral BID  . insulin aspart  0-15 Units Subcutaneous TID WC  . insulin aspart  0-5 Units Subcutaneous QHS  . insulin aspart protamine- aspart  20 Units Subcutaneous QAC supper  . insulin aspart protamine- aspart  35 Units Subcutaneous QAC breakfast  . oxybutynin  5 mg Oral QPM  . polyethylene glycol  17 g Oral BID  . pravastatin  10 mg Oral QHS  . prednisoLONE acetate  1 drop Right Eye BID  . sodium chloride flush  3 mL Intravenous Q12H  . tamsulosin  0.4 mg Oral Daily  . timolol  1 drop Right Eye BID   Continuous Infusions:   LOS: 9 days   Time spent: 25 minutes.  Patrecia Pour, MD [facility] [email or web address] 07/28/2019, 5:09 PM

## 2019-07-29 DIAGNOSIS — J9601 Acute respiratory failure with hypoxia: Secondary | ICD-10-CM | POA: Diagnosis not present

## 2019-07-29 DIAGNOSIS — E119 Type 2 diabetes mellitus without complications: Secondary | ICD-10-CM | POA: Diagnosis not present

## 2019-07-29 DIAGNOSIS — R0603 Acute respiratory distress: Secondary | ICD-10-CM | POA: Diagnosis not present

## 2019-07-29 DIAGNOSIS — Z7401 Bed confinement status: Secondary | ICD-10-CM | POA: Diagnosis not present

## 2019-07-29 DIAGNOSIS — J1281 Pneumonia due to SARS-associated coronavirus: Secondary | ICD-10-CM | POA: Diagnosis not present

## 2019-07-29 DIAGNOSIS — R0902 Hypoxemia: Secondary | ICD-10-CM | POA: Diagnosis not present

## 2019-07-29 DIAGNOSIS — I10 Essential (primary) hypertension: Secondary | ICD-10-CM | POA: Diagnosis not present

## 2019-07-29 DIAGNOSIS — J1289 Other viral pneumonia: Secondary | ICD-10-CM | POA: Diagnosis not present

## 2019-07-29 DIAGNOSIS — M2550 Pain in unspecified joint: Secondary | ICD-10-CM | POA: Diagnosis not present

## 2019-07-29 DIAGNOSIS — U071 COVID-19: Secondary | ICD-10-CM | POA: Diagnosis not present

## 2019-07-29 DIAGNOSIS — C61 Malignant neoplasm of prostate: Secondary | ICD-10-CM | POA: Diagnosis not present

## 2019-07-29 DIAGNOSIS — R0602 Shortness of breath: Secondary | ICD-10-CM | POA: Diagnosis not present

## 2019-07-29 DIAGNOSIS — H409 Unspecified glaucoma: Secondary | ICD-10-CM | POA: Diagnosis not present

## 2019-07-29 DIAGNOSIS — J9600 Acute respiratory failure, unspecified whether with hypoxia or hypercapnia: Secondary | ICD-10-CM | POA: Diagnosis not present

## 2019-07-29 DIAGNOSIS — N401 Enlarged prostate with lower urinary tract symptoms: Secondary | ICD-10-CM | POA: Diagnosis not present

## 2019-07-29 LAB — GLUCOSE, CAPILLARY: Glucose-Capillary: 141 mg/dL — ABNORMAL HIGH (ref 70–99)

## 2019-07-29 NOTE — TOC Transition Note (Addendum)
Transition of Care [facility]) - CM/SW Discharge Note   Patient Details  Name: Randy Lee MRN: [id number] Date of Birth: 10-Dec-1950  Transition of Care [facility]) CM/SW Contact:  Amador Cunas, [place] Phone Number: 07/29/2019, 10:19 AM   Clinical Narrative:   Pt for d/c to [facility] today. Notified pt's wife, Caren Griffins [phone], who reports agreeable to d/c. Confirmed with Gerald Stabs at [facility] pt will admit to room 206 and RN to call report to [phone]. SW signing off at d/c.   Wandra Feinstein, MSW, LCSW [phone] (GV coverage)       Final next level of care: Skilled Nursing Facility Barriers to Discharge: No Barriers Identified   Patient Goals and CMS Choice   CMS Medicare.gov Compare Post Acute Care list provided to:: Other (Comment Required)(wife) Choice offered to / list presented to : Spouse  Discharge Placement PASRR number recieved: 07/28/19            Patient chooses bed at: [facility] Patient to be transferred to facility by: [place] Name of family member notified: Caren Griffins Patient and family notified of of transfer: 07/29/19  Discharge Plan and Services                                     Social Determinants of Health (SDOH) Interventions     Readmission Risk Interventions No flowsheet data found.

## 2019-07-29 NOTE — Progress Notes (Signed)
Mary at [facility] was given report. All questions answered.

## 2019-07-29 NOTE — Social Work (Signed)
Kara Dies received: [id number]; Randy Lee. Confirmed with Gerald Stabs at [facility] they are prepared to accept pt today. MD updated.   Wandra Feinstein, MSW, LCSW [phone] (GV coverage)
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
## 2019-08-01 DIAGNOSIS — I10 Essential (primary) hypertension: Secondary | ICD-10-CM | POA: Diagnosis not present

## 2019-08-01 DIAGNOSIS — E119 Type 2 diabetes mellitus without complications: Secondary | ICD-10-CM | POA: Diagnosis not present

## 2019-08-01 DIAGNOSIS — U071 COVID-19: Secondary | ICD-10-CM | POA: Diagnosis not present

## 2019-08-01 DIAGNOSIS — N401 Enlarged prostate with lower urinary tract symptoms: Secondary | ICD-10-CM | POA: Diagnosis not present

## 2019-08-04 DIAGNOSIS — H409 Unspecified glaucoma: Secondary | ICD-10-CM | POA: Diagnosis not present

## 2019-08-04 DIAGNOSIS — U071 COVID-19: Secondary | ICD-10-CM | POA: Diagnosis not present

## 2019-08-04 DIAGNOSIS — E119 Type 2 diabetes mellitus without complications: Secondary | ICD-10-CM | POA: Diagnosis not present

## 2019-08-04 DIAGNOSIS — I10 Essential (primary) hypertension: Secondary | ICD-10-CM | POA: Diagnosis not present

## 2019-08-10 ENCOUNTER — Other Ambulatory Visit: Payer: Self-pay

## 2019-08-10 NOTE — Patient Outreach (Signed)
[facility]) Care Management  08/10/2019  Randy Lee 12-Aug-1951 [id number]    EMMI-General Discharge RED ON EMMI ALERT Day # 4 Date: 08/09/2019 Red Alert Reason: " Got discharge papers? No Know who to call about changes in condition? No  Scheduled follow up? No"   Outreach attempt # 1 to patient. Spoke with patient who denies any acute issues or concerns at present. He reports that he is doing well since returning home. Reviewed and addressed red alerts with patient. He confirms that he has MD's contact info and know how and when to contact them. He reports that he does not have MD appt scheduled but will call and make an appt. Patient states he does not remember getting any discharge papers when he left rehab center. However, he voices that he already knows his meds as he is on the same regimen he was on prior to admission. He reports that he lost his insurance card and inquired on how to obtain a new one. RN CM instructed pt on how to get replacement card. He voiced understanding. He denies any further RN CM needs or concerns at this time and declined follow up.    Plan: RN CM will close case at this time.  Enzo Montgomery, RN,BSN,CCM [facility] Telephonic Care Management Coordinator Direct Phone: [phone] Toll Free: [phone] Fax: [phone]

## 2019-09-08 DIAGNOSIS — R972 Elevated prostate specific antigen [PSA]: Secondary | ICD-10-CM | POA: Diagnosis not present

## 2019-09-08 DIAGNOSIS — C61 Malignant neoplasm of prostate: Secondary | ICD-10-CM | POA: Diagnosis not present

## 2019-09-10 ENCOUNTER — Emergency Department (HOSPITAL_COMMUNITY): Payer: Medicare PPO

## 2019-09-10 ENCOUNTER — Other Ambulatory Visit: Payer: Self-pay

## 2019-09-10 ENCOUNTER — Inpatient Hospital Stay (HOSPITAL_COMMUNITY)
Admission: EM | Admit: 2019-09-10 | Discharge: 2019-09-13 | DRG: 177 | Disposition: A | Discharge status: Home / Self Care | Payer: Medicare PPO | Attending: Family Medicine | Admitting: Family Medicine

## 2019-09-10 ENCOUNTER — Encounter (HOSPITAL_COMMUNITY): Payer: Self-pay | Admitting: Emergency Medicine

## 2019-09-10 DIAGNOSIS — R5381 Other malaise: Secondary | ICD-10-CM | POA: Diagnosis not present

## 2019-09-10 DIAGNOSIS — E785 Hyperlipidemia, unspecified: Secondary | ICD-10-CM | POA: Diagnosis present

## 2019-09-10 DIAGNOSIS — Z794 Long term (current) use of insulin: Secondary | ICD-10-CM | POA: Diagnosis not present

## 2019-09-10 DIAGNOSIS — K219 Gastro-esophageal reflux disease without esophagitis: Secondary | ICD-10-CM | POA: Diagnosis present

## 2019-09-10 DIAGNOSIS — Z7982 Long term (current) use of aspirin: Secondary | ICD-10-CM

## 2019-09-10 DIAGNOSIS — H409 Unspecified glaucoma: Secondary | ICD-10-CM | POA: Diagnosis present

## 2019-09-10 DIAGNOSIS — R0602 Shortness of breath: Secondary | ICD-10-CM | POA: Diagnosis not present

## 2019-09-10 DIAGNOSIS — E119 Type 2 diabetes mellitus without complications: Secondary | ICD-10-CM | POA: Diagnosis present

## 2019-09-10 DIAGNOSIS — I10 Essential (primary) hypertension: Secondary | ICD-10-CM | POA: Diagnosis present

## 2019-09-10 DIAGNOSIS — H547 Unspecified visual loss: Secondary | ICD-10-CM | POA: Diagnosis present

## 2019-09-10 DIAGNOSIS — R05 Cough: Secondary | ICD-10-CM | POA: Diagnosis not present

## 2019-09-10 DIAGNOSIS — B1920 Unspecified viral hepatitis C without hepatic coma: Secondary | ICD-10-CM | POA: Diagnosis present

## 2019-09-10 DIAGNOSIS — R509 Fever, unspecified: Secondary | ICD-10-CM | POA: Diagnosis present

## 2019-09-10 DIAGNOSIS — U071 COVID-19: Secondary | ICD-10-CM | POA: Diagnosis present

## 2019-09-10 DIAGNOSIS — Z8249 Family history of ischemic heart disease and other diseases of the circulatory system: Secondary | ICD-10-CM | POA: Diagnosis not present

## 2019-09-10 DIAGNOSIS — J1282 Pneumonia due to coronavirus disease 2019: Secondary | ICD-10-CM | POA: Diagnosis present

## 2019-09-10 DIAGNOSIS — E1165 Type 2 diabetes mellitus with hyperglycemia: Secondary | ICD-10-CM | POA: Diagnosis not present

## 2019-09-10 DIAGNOSIS — Z743 Need for continuous supervision: Secondary | ICD-10-CM | POA: Diagnosis not present

## 2019-09-10 DIAGNOSIS — E1169 Type 2 diabetes mellitus with other specified complication: Secondary | ICD-10-CM

## 2019-09-10 HISTORY — DX: Hyperlipidemia, unspecified: E78.5

## 2019-09-10 LAB — CBC WITH DIFFERENTIAL/PLATELET
Abs Immature Granulocytes: 0.03 10*3/uL (ref 0.00–0.07)
Basophils Absolute: 0.1 10*3/uL (ref 0.0–0.1)
Basophils Relative: 0 %
Eosinophils Absolute: 0.2 10*3/uL (ref 0.0–0.5)
Eosinophils Relative: 2 %
HCT: 41 % (ref 39.0–52.0)
Hemoglobin: 13.4 g/dL (ref 13.0–17.0)
Immature Granulocytes: 0 %
Lymphocytes Relative: 18 %
Lymphs Abs: 2.4 10*3/uL (ref 0.7–4.0)
MCH: 30.1 pg (ref 26.0–34.0)
MCHC: 32.7 g/dL (ref 30.0–36.0)
MCV: 92.1 fL (ref 80.0–100.0)
Monocytes Absolute: 0.8 10*3/uL (ref 0.1–1.0)
Monocytes Relative: 6 %
Neutro Abs: 9.6 10*3/uL — ABNORMAL HIGH (ref 1.7–7.7)
Neutrophils Relative %: 74 %
Platelets: 243 10*3/uL (ref 150–400)
RBC: 4.45 MIL/uL (ref 4.22–5.81)
RDW: 14.1 % (ref 11.5–15.5)
WBC: 13 10*3/uL — ABNORMAL HIGH (ref 4.0–10.5)
nRBC: 0 % (ref 0.0–0.2)

## 2019-09-10 LAB — LACTIC ACID, PLASMA: Lactic Acid, Venous: 3.3 mmol/L (ref 0.5–1.9)

## 2019-09-10 NOTE — ED Triage Notes (Signed)
Pt c/o fever, cough, and chills that started today. States "it feels like the flu." states he doesn't know how high his temperature got because he didn't check it, temp 99.9 in triage.

## 2019-09-10 NOTE — ED Notes (Signed)
EKG done and seen by Dr Pollina 
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
## 2019-09-11 ENCOUNTER — Other Ambulatory Visit: Payer: Self-pay

## 2019-09-11 ENCOUNTER — Encounter (HOSPITAL_COMMUNITY): Payer: Self-pay | Admitting: Internal Medicine

## 2019-09-11 DIAGNOSIS — J1282 Pneumonia due to coronavirus disease 2019: Secondary | ICD-10-CM | POA: Diagnosis present

## 2019-09-11 DIAGNOSIS — U071 COVID-19: Secondary | ICD-10-CM | POA: Diagnosis present

## 2019-09-11 DIAGNOSIS — I10 Essential (primary) hypertension: Secondary | ICD-10-CM | POA: Diagnosis present

## 2019-09-11 DIAGNOSIS — K219 Gastro-esophageal reflux disease without esophagitis: Secondary | ICD-10-CM | POA: Diagnosis present

## 2019-09-11 DIAGNOSIS — E119 Type 2 diabetes mellitus without complications: Secondary | ICD-10-CM | POA: Diagnosis present

## 2019-09-11 DIAGNOSIS — E1165 Type 2 diabetes mellitus with hyperglycemia: Secondary | ICD-10-CM | POA: Diagnosis not present

## 2019-09-11 DIAGNOSIS — R509 Fever, unspecified: Secondary | ICD-10-CM | POA: Diagnosis present

## 2019-09-11 DIAGNOSIS — B1920 Unspecified viral hepatitis C without hepatic coma: Secondary | ICD-10-CM | POA: Diagnosis present

## 2019-09-11 DIAGNOSIS — E785 Hyperlipidemia, unspecified: Secondary | ICD-10-CM | POA: Diagnosis present

## 2019-09-11 DIAGNOSIS — H409 Unspecified glaucoma: Secondary | ICD-10-CM | POA: Diagnosis present

## 2019-09-11 DIAGNOSIS — Z8249 Family history of ischemic heart disease and other diseases of the circulatory system: Secondary | ICD-10-CM | POA: Diagnosis not present

## 2019-09-11 DIAGNOSIS — Z794 Long term (current) use of insulin: Secondary | ICD-10-CM | POA: Diagnosis not present

## 2019-09-11 DIAGNOSIS — H547 Unspecified visual loss: Secondary | ICD-10-CM | POA: Diagnosis present

## 2019-09-11 DIAGNOSIS — Z7982 Long term (current) use of aspirin: Secondary | ICD-10-CM | POA: Diagnosis not present

## 2019-09-11 LAB — BASIC METABOLIC PANEL
Anion gap: 13 (ref 5–15)
BUN: 17 mg/dL (ref 8–23)
CO2: 25 mmol/L (ref 22–32)
Calcium: 9.7 mg/dL (ref 8.9–10.3)
Chloride: 100 mmol/L (ref 98–111)
Creatinine, Ser: 1.02 mg/dL (ref 0.61–1.24)
GFR calc Af Amer: >60 mL/min (ref >60)
GFR calc non Af Amer: >60 mL/min (ref >60)
Glucose, Bld: 181 mg/dL — ABNORMAL HIGH (ref 70–99)
Potassium: 3.7 mmol/L (ref 3.5–5.1)
Sodium: 138 mmol/L (ref 135–145)

## 2019-09-11 LAB — COMPREHENSIVE METABOLIC PANEL
ALT: 19 U/L (ref 0–44)
AST: 18 U/L (ref 15–41)
Albumin: 4 g/dL (ref 3.5–5.0)
Alkaline Phosphatase: 51 U/L (ref 38–126)
Anion gap: 13 (ref 5–15)
BUN: 16 mg/dL (ref 8–23)
CO2: 25 mmol/L (ref 22–32)
Calcium: 9.7 mg/dL (ref 8.9–10.3)
Chloride: 100 mmol/L (ref 98–111)
Creatinine, Ser: 0.99 mg/dL (ref 0.61–1.24)
GFR calc Af Amer: >60 mL/min (ref >60)
GFR calc non Af Amer: >60 mL/min (ref >60)
Glucose, Bld: 179 mg/dL — ABNORMAL HIGH (ref 70–99)
Potassium: 3.7 mmol/L (ref 3.5–5.1)
Sodium: 138 mmol/L (ref 135–145)
Total Bilirubin: 0.5 mg/dL (ref 0.3–1.2)
Total Protein: 7.3 g/dL (ref 6.5–8.1)

## 2019-09-11 LAB — GLUCOSE, CAPILLARY
Glucose-Capillary: 129 mg/dL — ABNORMAL HIGH (ref 70–99)
Glucose-Capillary: 181 mg/dL — ABNORMAL HIGH (ref 70–99)
Glucose-Capillary: 182 mg/dL — ABNORMAL HIGH (ref 70–99)
Glucose-Capillary: 214 mg/dL — ABNORMAL HIGH (ref 70–99)
Glucose-Capillary: 224 mg/dL — ABNORMAL HIGH (ref 70–99)
Glucose-Capillary: 227 mg/dL — ABNORMAL HIGH (ref 70–99)
Glucose-Capillary: 246 mg/dL — ABNORMAL HIGH (ref 70–99)

## 2019-09-11 LAB — RESPIRATORY PANEL BY RT PCR (FLU A&B, COVID)
Influenza A by PCR: NEGATIVE
Influenza B by PCR: NEGATIVE
SARS Coronavirus 2 by RT PCR: POSITIVE — AB

## 2019-09-11 LAB — LACTATE DEHYDROGENASE: LDH: 150 U/L (ref 98–192)

## 2019-09-11 LAB — PHOSPHORUS: Phosphorus: 3 mg/dL (ref 2.5–4.6)

## 2019-09-11 LAB — C-REACTIVE PROTEIN: CRP: 1.6 mg/dL — ABNORMAL HIGH (ref <1.0)

## 2019-09-11 LAB — D-DIMER, QUANTITATIVE: D-Dimer, Quant: 0.7 ug/mL-FEU — ABNORMAL HIGH (ref 0.00–0.50)

## 2019-09-11 LAB — HEMOGLOBIN A1C
Hgb A1c MFr Bld: 6.8 % — ABNORMAL HIGH (ref 4.8–5.6)
Mean Plasma Glucose: 148.46 mg/dL

## 2019-09-11 LAB — MAGNESIUM: Magnesium: 1.8 mg/dL (ref 1.7–2.4)

## 2019-09-11 LAB — PROCALCITONIN: Procalcitonin: <0.1 ng/mL

## 2019-09-11 LAB — BRAIN NATRIURETIC PEPTIDE: B Natriuretic Peptide: 57 pg/mL (ref 0.0–100.0)

## 2019-09-11 LAB — FIBRINOGEN: Fibrinogen: 398 mg/dL (ref 210–475)

## 2019-09-11 MED ORDER — GUAIFENESIN-DM 100-10 MG/5ML PO SYRP: 10.0000 mL | ORAL_SOLUTION | ORAL | Status: DC | PRN

## 2019-09-11 MED ORDER — TIMOLOL MALEATE 0.5 % OP SOLN: 1.0000 [drp] | Freq: Two times a day (BID) | OPHTHALMIC | Status: DC

## 2019-09-11 MED ORDER — GABAPENTIN 300 MG PO CAPS
ORAL_CAPSULE | ORAL | Status: AC
  Filled 2019-09-11: qty 2

## 2019-09-11 MED ORDER — ZINC SULFATE 220 (50 ZN) MG PO CAPS
220.0000 mg | ORAL_CAPSULE | Freq: Every day | ORAL | Status: DC
  Administered 2019-09-11 – 2019-09-13 (×3): 220 mg via ORAL
  Filled 2019-09-11 (×3): qty 1

## 2019-09-11 MED ORDER — DEXAMETHASONE SODIUM PHOSPHATE 10 MG/ML IJ SOLN
6.0000 mg | INTRAMUSCULAR | Status: DC
  Administered 2019-09-11 – 2019-09-13 (×3): 6 mg via INTRAVENOUS
  Filled 2019-09-11 (×4): qty 1

## 2019-09-11 MED ORDER — INSULIN ASPART PROT & ASPART (70-30 MIX) 100 UNIT/ML PEN
20.0000 [IU] | PEN_INJECTOR | SUBCUTANEOUS | Status: DC
  Filled 2019-09-11: qty 3

## 2019-09-11 MED ORDER — ALBUTEROL SULFATE HFA 108 (90 BASE) MCG/ACT IN AERS
INHALATION_SPRAY | RESPIRATORY_TRACT | Status: AC
  Filled 2019-09-11: qty 6.7

## 2019-09-11 MED ORDER — ALBUTEROL SULFATE HFA 108 (90 BASE) MCG/ACT IN AERS: 2.0000 | INHALATION_SPRAY | Freq: Four times a day (QID) | RESPIRATORY_TRACT | Status: DC | PRN

## 2019-09-11 MED ORDER — LINAGLIPTIN 5 MG PO TABS
5.0000 mg | ORAL_TABLET | Freq: Every day | ORAL | Status: DC
  Administered 2019-09-11: 5 mg via ORAL
  Filled 2019-09-11 (×2): qty 1

## 2019-09-11 MED ORDER — INSULIN ASPART 100 UNIT/ML ~~LOC~~ SOLN
0.0000 [IU] | SUBCUTANEOUS | Status: DC
  Administered 2019-09-11: 12:00:00 4 [IU] via SUBCUTANEOUS
  Administered 2019-09-11: 7 [IU] via SUBCUTANEOUS
  Administered 2019-09-11: 04:00:00 4 [IU] via SUBCUTANEOUS
  Administered 2019-09-11 – 2019-09-12 (×4): 7 [IU] via SUBCUTANEOUS
  Administered 2019-09-12: 10:00:00 11 [IU] via SUBCUTANEOUS
  Administered 2019-09-12 (×2): 3 [IU] via SUBCUTANEOUS
  Administered 2019-09-12 – 2019-09-13 (×2): 4 [IU] via SUBCUTANEOUS
  Administered 2019-09-13: 3 [IU] via SUBCUTANEOUS
  Administered 2019-09-13: 12:00:00 4 [IU] via SUBCUTANEOUS
  Administered 2019-09-13: 17:00:00 7 [IU] via SUBCUTANEOUS

## 2019-09-11 MED ORDER — TIMOLOL MALEATE 0.5 % OP SOLN
1.0000 [drp] | Freq: Two times a day (BID) | OPHTHALMIC | Status: DC
  Administered 2019-09-12 – 2019-09-13 (×3): 1 [drp] via OPHTHALMIC

## 2019-09-11 MED ORDER — METFORMIN HCL 500 MG PO TABS
1000.0000 mg | ORAL_TABLET | Freq: Two times a day (BID) | ORAL | Status: DC
  Administered 2019-09-12 – 2019-09-13 (×4): 1000 mg via ORAL
  Filled 2019-09-11 (×4): qty 2

## 2019-09-11 MED ORDER — GABAPENTIN 300 MG PO CAPS
600.0000 mg | ORAL_CAPSULE | Freq: Three times a day (TID) | ORAL | Status: DC
  Administered 2019-09-11 – 2019-09-13 (×6): 600 mg via ORAL
  Filled 2019-09-11 (×5): qty 2

## 2019-09-11 MED ORDER — ASCORBIC ACID 500 MG PO TABS
500.0000 mg | ORAL_TABLET | Freq: Every day | ORAL | Status: DC
  Administered 2019-09-11 – 2019-09-13 (×3): 500 mg via ORAL
  Filled 2019-09-11 (×3): qty 1

## 2019-09-11 MED ORDER — HYDROCOD POLST-CPM POLST ER 10-8 MG/5ML PO SUER: 5.0000 mL | Freq: Two times a day (BID) | ORAL | Status: DC | PRN

## 2019-09-11 MED ORDER — ENOXAPARIN SODIUM 40 MG/0.4ML ~~LOC~~ SOLN
40.0000 mg | SUBCUTANEOUS | Status: DC
  Administered 2019-09-11 – 2019-09-13 (×3): 40 mg via SUBCUTANEOUS
  Filled 2019-09-11 (×3): qty 0.4

## 2019-09-11 MED ORDER — IPRATROPIUM-ALBUTEROL 20-100 MCG/ACT IN AERS
2.0000 | INHALATION_SPRAY | Freq: Four times a day (QID) | RESPIRATORY_TRACT | Status: DC
  Filled 2019-09-11: qty 4

## 2019-09-11 MED ORDER — PREDNISOLONE ACETATE 1 % OP SUSP
1.0000 [drp] | Freq: Two times a day (BID) | OPHTHALMIC | Status: DC
  Administered 2019-09-11: 1 [drp] via OPHTHALMIC
  Filled 2019-09-11: qty 1
  Filled 2019-09-11: qty 5

## 2019-09-11 MED ORDER — SODIUM CHLORIDE 0.9 % IV SOLN
100.0000 mg | Freq: Every day | INTRAVENOUS | Status: DC
  Administered 2019-09-12 – 2019-09-13 (×2): 100 mg via INTRAVENOUS
  Filled 2019-09-11 (×4): qty 20

## 2019-09-11 MED ORDER — ALBUTEROL SULFATE HFA 108 (90 BASE) MCG/ACT IN AERS: 1.0000 | INHALATION_SPRAY | Freq: Four times a day (QID) | RESPIRATORY_TRACT | Status: DC | PRN

## 2019-09-11 MED ORDER — POTASSIUM CHLORIDE CRYS ER 20 MEQ PO TBCR
20.0000 meq | EXTENDED_RELEASE_TABLET | Freq: Once | ORAL | Status: AC
  Administered 2019-09-11: 20 meq via ORAL
  Filled 2019-09-11: qty 1

## 2019-09-11 MED ORDER — GABAPENTIN 600 MG PO TABS
600.0000 mg | ORAL_TABLET | Freq: Three times a day (TID) | ORAL | Status: DC
  Filled 2019-09-11 (×5): qty 1

## 2019-09-11 MED ORDER — FINASTERIDE 5 MG PO TABS
5.0000 mg | ORAL_TABLET | Freq: Every morning | ORAL | Status: DC
  Administered 2019-09-12 – 2019-09-13 (×2): 5 mg via ORAL
  Filled 2019-09-11 (×2): qty 1

## 2019-09-11 MED ORDER — PREDNISOLONE ACETATE 1 % OP SUSP
1.0000 [drp] | Freq: Two times a day (BID) | OPHTHALMIC | Status: DC
  Administered 2019-09-12 – 2019-09-13 (×3): 1 [drp] via OPHTHALMIC
  Filled 2019-09-11: qty 1

## 2019-09-11 MED ORDER — CANAGLIFLOZIN 100 MG PO TABS
100.0000 mg | ORAL_TABLET | Freq: Every day | ORAL | Status: DC
  Administered 2019-09-12 – 2019-09-13 (×2): 100 mg via ORAL
  Filled 2019-09-11 (×4): qty 1

## 2019-09-11 MED ORDER — INSULIN DETEMIR 100 UNIT/ML ~~LOC~~ SOLN
0.1000 [IU]/kg | Freq: Two times a day (BID) | SUBCUTANEOUS | Status: DC
  Administered 2019-09-11 (×2): 8 [IU] via SUBCUTANEOUS
  Filled 2019-09-11 (×6): qty 0.08

## 2019-09-11 MED ORDER — OXYBUTYNIN CHLORIDE 5 MG PO TABS
5.0000 mg | ORAL_TABLET | Freq: Every evening | ORAL | Status: DC
  Administered 2019-09-11 – 2019-09-13 (×3): 5 mg via ORAL
  Filled 2019-09-11 (×4): qty 1

## 2019-09-11 MED ORDER — TAMSULOSIN HCL 0.4 MG PO CAPS
0.4000 mg | ORAL_CAPSULE | Freq: Every day | ORAL | Status: DC
  Administered 2019-09-11 – 2019-09-13 (×3): 0.4 mg via ORAL
  Filled 2019-09-11 (×3): qty 1

## 2019-09-11 MED ORDER — ACETAMINOPHEN 325 MG PO TABS
650.0000 mg | ORAL_TABLET | Freq: Four times a day (QID) | ORAL | Status: DC | PRN
  Administered 2019-09-11 – 2019-09-12 (×2): 650 mg via ORAL
  Filled 2019-09-11: qty 2

## 2019-09-11 MED ORDER — PRAVASTATIN SODIUM 10 MG PO TABS
10.0000 mg | ORAL_TABLET | Freq: Every day | ORAL | Status: DC
  Administered 2019-09-11 – 2019-09-12 (×2): 10 mg via ORAL
  Filled 2019-09-11 (×2): qty 1

## 2019-09-11 MED ORDER — ACETAMINOPHEN 650 MG RE SUPP: 650.0000 mg | Freq: Four times a day (QID) | RECTAL | Status: DC | PRN

## 2019-09-11 MED ORDER — SODIUM CHLORIDE 0.9 % IV SOLN
200.0000 mg | Freq: Once | INTRAVENOUS | Status: AC
  Administered 2019-09-11: 200 mg via INTRAVENOUS
  Filled 2019-09-11: qty 40

## 2019-09-11 MED ORDER — AMLODIPINE BESYLATE 5 MG PO TABS
5.0000 mg | ORAL_TABLET | Freq: Every morning | ORAL | Status: DC
  Administered 2019-09-12 – 2019-09-13 (×2): 5 mg via ORAL
  Filled 2019-09-11 (×2): qty 1

## 2019-09-11 MED ORDER — TIMOLOL MALEATE 0.5 % OP SOLN
1.0000 [drp] | Freq: Two times a day (BID) | OPHTHALMIC | Status: DC
  Administered 2019-09-11: 17:00:00 1 [drp] via OPHTHALMIC
  Filled 2019-09-11: qty 5

## 2019-09-11 MED ORDER — ASPIRIN EC 81 MG PO TBEC
81.0000 mg | DELAYED_RELEASE_TABLET | Freq: Every morning | ORAL | Status: DC
  Administered 2019-09-12 – 2019-09-13 (×2): 81 mg via ORAL
  Filled 2019-09-11 (×2): qty 1

## 2019-09-11 MED ORDER — PREDNISOLONE ACETATE 1 % OP SUSP: 1.0000 [drp] | Freq: Two times a day (BID) | OPHTHALMIC | Status: DC

## 2019-09-11 NOTE — Plan of Care (Signed)
  Problem: Education: Goal: Knowledge of General Education information will improve Description Including pain rating scale, medication(s)/side effects and non-pharmacologic comfort measures Outcome: Progressing   
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
## 2019-09-11 NOTE — Progress Notes (Signed)
Patient admitted to the hospital earlier this morning by Dr. Olevia Bowens  Patient seen and examined.  He is feeling better.  Shortness of breath is improving.  Lungs are clear on exam.  Assessment/plan  Pneumonia due to COVID-19.  Started on dexamethasone remdesivir.  Continue to follow inflammatory markers.  Clinically improving.  Type 2 diabetes.  Continue to follow blood sugars while on steroids.  He is chronically on Metformin and Invokana.  He also takes 70/30 insulin.  Continue on sliding scale insulin.  Hypertension.  Continue on amlodipine.  Hold lisinopril and hydrochlorothiazide  Anticipate discharge home soon if he continues to improve and outpatient remdesivir can be arranged.  [employer]

## 2019-09-11 NOTE — Progress Notes (Signed)
Patient assessed with RT protocol. Patient scored a 5 and is in no respiratory distress at this time. Patient does not have COPD but does have a slight smoking history from years ago. Patient is 96% on room air and breath sounds are clear but decreased. Recommend PRN albuterol patient agrees with assessment and orders modified. Albuterol inhaler at bedside if needed.

## 2019-09-11 NOTE — ED Notes (Signed)
Date and time results received: 09/11/19 0054(use smartphrase ".now" to insert current time)  Test: Covid-RT-PCR Critical Value: positive Covid  Name of Provider Notified: Dr Betsey Holiday  Orders Received? Or Actions Taken?: consult to hospitalist

## 2019-09-11 NOTE — ED Notes (Signed)
Date and time results received: 09/11/19 12:00 AM (use smartphrase ".now" to insert current time)  Test: lactic acid Critical Value: 3.3  Name of Provider Notified: Dr Betsey Holiday  Orders Received? Or Actions Taken?: see chart

## 2019-09-12 LAB — CBC WITH DIFFERENTIAL/PLATELET
Abs Immature Granulocytes: 0.05 10*3/uL (ref 0.00–0.07)
Basophils Absolute: 0 10*3/uL (ref 0.0–0.1)
Basophils Relative: 0 %
Eosinophils Absolute: 0 10*3/uL (ref 0.0–0.5)
Eosinophils Relative: 0 %
HCT: 38.5 % — ABNORMAL LOW (ref 39.0–52.0)
Hemoglobin: 12.7 g/dL — ABNORMAL LOW (ref 13.0–17.0)
Immature Granulocytes: 1 %
Lymphocytes Relative: 12 %
Lymphs Abs: 1.2 10*3/uL (ref 0.7–4.0)
MCH: 29.7 pg (ref 26.0–34.0)
MCHC: 33 g/dL (ref 30.0–36.0)
MCV: 90 fL (ref 80.0–100.0)
Monocytes Absolute: 0.2 10*3/uL (ref 0.1–1.0)
Monocytes Relative: 2 %
Neutro Abs: 8.6 10*3/uL — ABNORMAL HIGH (ref 1.7–7.7)
Neutrophils Relative %: 85 %
Platelets: 252 10*3/uL (ref 150–400)
RBC: 4.28 MIL/uL (ref 4.22–5.81)
RDW: 13.9 % (ref 11.5–15.5)
WBC: 10 10*3/uL (ref 4.0–10.5)
nRBC: 0 % (ref 0.0–0.2)

## 2019-09-12 LAB — COMPREHENSIVE METABOLIC PANEL
ALT: 17 U/L (ref 0–44)
AST: 15 U/L (ref 15–41)
Albumin: 3.8 g/dL (ref 3.5–5.0)
Alkaline Phosphatase: 46 U/L (ref 38–126)
Anion gap: 10 (ref 5–15)
BUN: 22 mg/dL (ref 8–23)
CO2: 26 mmol/L (ref 22–32)
Calcium: 9.8 mg/dL (ref 8.9–10.3)
Chloride: 99 mmol/L (ref 98–111)
Creatinine, Ser: 0.89 mg/dL (ref 0.61–1.24)
GFR calc Af Amer: >60 mL/min (ref >60)
GFR calc non Af Amer: >60 mL/min (ref >60)
Glucose, Bld: 266 mg/dL — ABNORMAL HIGH (ref 70–99)
Potassium: 4.1 mmol/L (ref 3.5–5.1)
Sodium: 135 mmol/L (ref 135–145)
Total Bilirubin: 0.6 mg/dL (ref 0.3–1.2)
Total Protein: 7.2 g/dL (ref 6.5–8.1)

## 2019-09-12 LAB — MAGNESIUM: Magnesium: 1.8 mg/dL (ref 1.7–2.4)

## 2019-09-12 LAB — PHOSPHORUS: Phosphorus: 3.1 mg/dL (ref 2.5–4.6)

## 2019-09-12 LAB — GLUCOSE, CAPILLARY
Glucose-Capillary: 233 mg/dL — ABNORMAL HIGH (ref 70–99)
Glucose-Capillary: 270 mg/dL — ABNORMAL HIGH (ref 70–99)
Glucose-Capillary: 196 mg/dL — ABNORMAL HIGH (ref 70–99)
Glucose-Capillary: 134 mg/dL — ABNORMAL HIGH (ref 70–99)
Glucose-Capillary: 140 mg/dL — ABNORMAL HIGH (ref 70–99)

## 2019-09-12 LAB — FERRITIN: Ferritin: 63 ng/mL (ref 24–336)

## 2019-09-12 LAB — D-DIMER, QUANTITATIVE: D-Dimer, Quant: 0.35 ug/mL-FEU (ref 0.00–0.50)

## 2019-09-12 LAB — C-REACTIVE PROTEIN: CRP: 3.7 mg/dL — ABNORMAL HIGH (ref <1.0)

## 2019-09-12 MED ORDER — INSULIN ASPART PROT & ASPART (70-30 MIX) 100 UNIT/ML ~~LOC~~ SUSP
30.0000 [IU] | Freq: Every day | SUBCUTANEOUS | Status: DC
  Administered 2019-09-12 – 2019-09-13 (×2): 30 [IU] via SUBCUTANEOUS
  Filled 2019-09-12: qty 10

## 2019-09-12 MED ORDER — INSULIN ASPART PROT & ASPART (70-30 MIX) 100 UNIT/ML ~~LOC~~ SUSP
20.0000 [IU] | Freq: Every day | SUBCUTANEOUS | Status: DC
  Administered 2019-09-12 – 2019-09-13 (×2): 20 [IU] via SUBCUTANEOUS
  Filled 2019-09-12: qty 10
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
## 2019-09-13 DIAGNOSIS — Z794 Long term (current) use of insulin: Secondary | ICD-10-CM

## 2019-09-13 DIAGNOSIS — E785 Hyperlipidemia, unspecified: Secondary | ICD-10-CM

## 2019-09-13 DIAGNOSIS — E1165 Type 2 diabetes mellitus with hyperglycemia: Secondary | ICD-10-CM

## 2019-09-13 DIAGNOSIS — I10 Essential (primary) hypertension: Secondary | ICD-10-CM

## 2019-09-13 LAB — CBC WITH DIFFERENTIAL/PLATELET
Abs Immature Granulocytes: 0.07 10*3/uL (ref 0.00–0.07)
Basophils Absolute: 0 10*3/uL (ref 0.0–0.1)
Basophils Relative: 0 %
Eosinophils Absolute: 0 10*3/uL (ref 0.0–0.5)
Eosinophils Relative: 0 %
HCT: 37.7 % — ABNORMAL LOW (ref 39.0–52.0)
Hemoglobin: 12.4 g/dL — ABNORMAL LOW (ref 13.0–17.0)
Immature Granulocytes: 1 %
Lymphocytes Relative: 13 %
Lymphs Abs: 1.4 10*3/uL (ref 0.7–4.0)
MCH: 29.7 pg (ref 26.0–34.0)
MCHC: 32.9 g/dL (ref 30.0–36.0)
MCV: 90.2 fL (ref 80.0–100.0)
Monocytes Absolute: 0.3 10*3/uL (ref 0.1–1.0)
Monocytes Relative: 3 %
Neutro Abs: 8.9 10*3/uL — ABNORMAL HIGH (ref 1.7–7.7)
Neutrophils Relative %: 83 %
Platelets: 255 10*3/uL (ref 150–400)
RBC: 4.18 MIL/uL — ABNORMAL LOW (ref 4.22–5.81)
RDW: 13.9 % (ref 11.5–15.5)
WBC: 10.7 10*3/uL — ABNORMAL HIGH (ref 4.0–10.5)
nRBC: 0 % (ref 0.0–0.2)

## 2019-09-13 LAB — COMPREHENSIVE METABOLIC PANEL
ALT: 16 U/L (ref 0–44)
AST: 16 U/L (ref 15–41)
Albumin: 3.7 g/dL (ref 3.5–5.0)
Alkaline Phosphatase: 40 U/L (ref 38–126)
Anion gap: 13 (ref 5–15)
BUN: 27 mg/dL — ABNORMAL HIGH (ref 8–23)
CO2: 23 mmol/L (ref 22–32)
Calcium: 9.5 mg/dL (ref 8.9–10.3)
Chloride: 100 mmol/L (ref 98–111)
Creatinine, Ser: 0.98 mg/dL (ref 0.61–1.24)
GFR calc Af Amer: >60 mL/min (ref >60)
GFR calc non Af Amer: >60 mL/min (ref >60)
Glucose, Bld: 197 mg/dL — ABNORMAL HIGH (ref 70–99)
Potassium: 3.6 mmol/L (ref 3.5–5.1)
Sodium: 136 mmol/L (ref 135–145)
Total Bilirubin: 0.5 mg/dL (ref 0.3–1.2)
Total Protein: 6.5 g/dL (ref 6.5–8.1)

## 2019-09-13 LAB — GLUCOSE, CAPILLARY
Glucose-Capillary: 105 mg/dL — ABNORMAL HIGH (ref 70–99)
Glucose-Capillary: 149 mg/dL — ABNORMAL HIGH (ref 70–99)
Glucose-Capillary: 164 mg/dL — ABNORMAL HIGH (ref 70–99)
Glucose-Capillary: 175 mg/dL — ABNORMAL HIGH (ref 70–99)
Glucose-Capillary: 210 mg/dL — ABNORMAL HIGH (ref 70–99)

## 2019-09-13 LAB — D-DIMER, QUANTITATIVE: D-Dimer, Quant: 0.33 ug/mL-FEU (ref 0.00–0.50)

## 2019-09-13 LAB — FERRITIN: Ferritin: 51 ng/mL (ref 24–336)

## 2019-09-13 LAB — MAGNESIUM: Magnesium: 1.9 mg/dL (ref 1.7–2.4)

## 2019-09-13 LAB — PHOSPHORUS: Phosphorus: 3.1 mg/dL (ref 2.5–4.6)

## 2019-09-13 LAB — C-REACTIVE PROTEIN: CRP: 1.2 mg/dL — ABNORMAL HIGH (ref <1.0)

## 2019-09-13 MED ORDER — LISINOPRIL-HYDROCHLOROTHIAZIDE 20-25 MG PO TABS: 0.5000 | ORAL_TABLET | Freq: Two times a day (BID) | ORAL | 1 refills | Status: DC

## 2019-09-13 MED ORDER — ASPIRIN EC 81 MG PO TBEC: 81.0000 mg | DELAYED_RELEASE_TABLET | Freq: Every day | ORAL | 1 refills | Status: AC

## 2019-09-13 MED ORDER — AZITHROMYCIN 500 MG PO TABS: 500.0000 mg | ORAL_TABLET | Freq: Every day | ORAL | 0 refills | Status: DC

## 2019-09-13 MED ORDER — AZITHROMYCIN 500 MG PO TABS: 500.0000 mg | ORAL_TABLET | Freq: Every day | ORAL | 0 refills | Status: AC

## 2019-09-13 MED ORDER — ASCORBIC ACID 500 MG PO TABS: 500.0000 mg | ORAL_TABLET | Freq: Every day | ORAL | 0 refills | Status: AC

## 2019-09-13 MED ORDER — ALBUTEROL SULFATE HFA 108 (90 BASE) MCG/ACT IN AERS: 2.0000 | INHALATION_SPRAY | Freq: Four times a day (QID) | RESPIRATORY_TRACT | 0 refills | Status: AC | PRN

## 2019-09-13 MED ORDER — ASPIRIN EC 81 MG PO TBEC: 81.0000 mg | DELAYED_RELEASE_TABLET | Freq: Every day | ORAL | 1 refills | Status: DC

## 2019-09-13 MED ORDER — LISINOPRIL-HYDROCHLOROTHIAZIDE 20-25 MG PO TABS: 0.5000 | ORAL_TABLET | Freq: Two times a day (BID) | ORAL | 1 refills | Status: AC

## 2019-09-13 MED ORDER — AMLODIPINE BESYLATE 5 MG PO TABS: 5.0000 mg | ORAL_TABLET | Freq: Every morning | ORAL | 1 refills | Status: AC

## 2019-09-13 MED ORDER — ZINC SULFATE 220 (50 ZN) MG PO CAPS: 220.0000 mg | ORAL_CAPSULE | Freq: Every day | ORAL | 0 refills | Status: AC

## 2019-09-13 MED ORDER — ACETAMINOPHEN 325 MG PO TABS: 650.0000 mg | ORAL_TABLET | Freq: Four times a day (QID) | ORAL | 0 refills | Status: AC | PRN

## 2019-09-13 MED ORDER — PREDNISONE 20 MG PO TABS: 20.0000 mg | ORAL_TABLET | Freq: Every day | ORAL | 0 refills | Status: AC

## 2019-09-13 NOTE — TOC Progression Note (Addendum)
Arranged transportation with [facility] for pt to travel to the outpatient Infusion clinic for two remaining doses. Pt will be picked up at approximately 4:30PM on 1/26 and 1/27. Driver will contact pt to inform of specific pick up time. Pt lives in [place] and his appointment time is 5:30PM.  There are no other TOC needs identified at this time.   1428: Received communication from MD stating that pt does not need outpatient Remdesivir clinic follow up after all. Spoke with Demetra Shiner at [employer] to update and she stated she will cancel the transports.

## 2019-09-13 NOTE — Discharge Instructions (Addendum)
COVID-19: Quarantine vs. Isolation QUARANTINE keeps someone who was in close contact with someone who has COVID-19 away from others. If you had close contact with a person who has COVID-19  Stay home until 14 days after your last contact.  Check your temperature twice a day and watch for symptoms of COVID-19.  If possible, stay away from people who are at higher-risk for getting very sick from COVID-19. ISOLATION keeps someone who is sick or tested positive for COVID-19 without symptoms away from others, even in their own home. If you are sick and think or know you have COVID-19  Stay home until after ? At least 10 days since symptoms first appeared and ? At least 24 hours with no fever without fever-reducing medication and ? Symptoms have improved If you tested positive for COVID-19 but do not have symptoms  Stay home until after ? 10 days have passed since your positive test If you live with others, stay in a specific "sick room" or area and away from other people or animals, including pets. Use a separate bathroom, if available. [email or web address] 03/08/2019 This information is not intended to replace advice given to you by your health care provider. Make sure you discuss any questions you have with your health care provider. Document Revised: 07/22/2019 Document Reviewed: 07/22/2019 Elsevier Patient Education  2020 [place].   COVID-19 COVID-19 is a respiratory infection that is caused by a virus called severe acute respiratory syndrome coronavirus 2 (SARS-CoV-2). The disease is also known as coronavirus disease or novel coronavirus. In some people, the virus may not cause any symptoms. In others, it may cause a serious infection. The infection can get worse quickly and can lead to complications, such as:  Pneumonia, or infection of the lungs.  Acute respiratory distress syndrome or ARDS. This is a condition in which fluid build-up in the lungs prevents the lungs from  filling with air and passing oxygen into the blood.  Acute respiratory failure. This is a condition in which there is not enough oxygen passing from the lungs to the body or when carbon dioxide is not passing from the lungs out of the body.  Sepsis or septic shock. This is a serious bodily reaction to an infection.  Blood clotting problems.  Secondary infections due to bacteria or fungus.  Organ failure. This is when your body's organs stop working. The virus that causes COVID-19 is contagious. This means that it can spread from person to person through droplets from coughs and sneezes (respiratory secretions). What are the causes? This illness is caused by a virus. You may catch the virus by:  Breathing in droplets from an infected person. Droplets can be spread by a person breathing, speaking, singing, coughing, or sneezing.  Touching something, like a table or a doorknob, that was exposed to the virus (contaminated) and then touching your mouth, nose, or eyes. What increases the risk? Risk for infection You are more likely to be infected with this virus if you:  Are within 6 feet (2 meters) of a person with COVID-19.  Provide care for or live with a person who is infected with COVID-19.  Spend time in crowded indoor spaces or live in shared housing. Risk for serious illness You are more likely to become seriously ill from the virus if you:  Are 66 years of age or older. The higher your age, the more you are at risk for serious illness.  Live in a nursing home or long-term care facility.  Have cancer.  Have a long-term (chronic) disease such as: ? Chronic lung disease, including chronic obstructive pulmonary disease or asthma. ? A long-term disease that lowers your body's ability to fight infection (immunocompromised). ? Heart disease, including heart failure, a condition in which the arteries that lead to the heart become narrow or blocked (coronary artery disease), a  disease which makes the heart muscle thick, weak, or stiff (cardiomyopathy). ? Diabetes. ? Chronic kidney disease. ? Sickle cell disease, a condition in which red blood cells have an abnormal "sickle" shape. ? Liver disease.  Are obese. What are the signs or symptoms? Symptoms of this condition can range from mild to severe. Symptoms may appear any time from 2 to 14 days after being exposed to the virus. They include:  A fever or chills.  A cough.  Difficulty breathing.  Headaches, body aches, or muscle aches.  Runny or stuffy (congested) nose.  A sore throat.  New loss of taste or smell. Some people may also have stomach problems, such as nausea, vomiting, or diarrhea. Other people may not have any symptoms of COVID-19. How is this diagnosed? This condition may be diagnosed based on:  Your signs and symptoms, especially if: ? You live in an area with a COVID-19 outbreak. ? You recently traveled to or from an area where the virus is common. ? You provide care for or live with a person who was diagnosed with COVID-19. ? You were exposed to a person who was diagnosed with COVID-19.  A physical exam.  Lab tests, which may include: ? Taking a sample of fluid from the back of your nose and throat (nasopharyngeal fluid), your nose, or your throat using a swab. ? A sample of mucus from your lungs (sputum). ? Blood tests.  Imaging tests, which may include, X-rays, CT scan, or ultrasound. How is this treated? At present, there is no medicine to treat COVID-19. Medicines that treat other diseases are being used on a trial basis to see if they are effective against COVID-19. Your health care provider will talk with you about ways to treat your symptoms. For most people, the infection is mild and can be managed at home with rest, fluids, and over-the-counter medicines. Treatment for a serious infection usually takes places in a hospital intensive care unit (ICU). It may include one  or more of the following treatments. These treatments are given until your symptoms improve.  Receiving fluids and medicines through an IV.  Supplemental oxygen. Extra oxygen is given through a tube in the nose, a face mask, or a hood.  Positioning you to lie on your stomach (prone position). This makes it easier for oxygen to get into the lungs.  Continuous positive airway pressure (CPAP) or bi-level positive airway pressure (BPAP) machine. This treatment uses mild air pressure to keep the airways open. A tube that is connected to a motor delivers oxygen to the body.  Ventilator. This treatment moves air into and out of the lungs by using a tube that is placed in your windpipe.  Tracheostomy. This is a procedure to create a hole in the neck so that a breathing tube can be inserted.  Extracorporeal membrane oxygenation (ECMO). This procedure gives the lungs a chance to recover by taking over the functions of the heart and lungs. It supplies oxygen to the body and removes carbon dioxide. Follow these instructions at home: Lifestyle  If you are sick, stay home except to get medical care. Your health care provider  will tell you how long to stay home. Call your health care provider before you go for medical care.  Rest at home as told by your health care provider.  Do not use any products that contain nicotine or tobacco, such as cigarettes, e-cigarettes, and chewing tobacco. If you need help quitting, ask your health care provider.  Return to your normal activities as told by your health care provider. Ask your health care provider what activities are safe for you. General instructions  Take over-the-counter and prescription medicines only as told by your health care provider.  Drink enough fluid to keep your urine pale yellow.  Keep all follow-up visits as told by your health care provider. This is important. How is this prevented?  There is no vaccine to help prevent COVID-19  infection. However, there are steps you can take to protect yourself and others from this virus. To protect yourself:   Do not travel to areas where COVID-19 is a risk. The areas where COVID-19 is reported change often. To identify high-risk areas and travel restrictions, check the CDC travel website: FatFares.com.br  If you live in, or must travel to, an area where COVID-19 is a risk, take precautions to avoid infection. ? Stay away from people who are sick. ? Wash your hands often with soap and water for 20 seconds. If soap and water are not available, use an alcohol-based hand sanitizer. ? Avoid touching your mouth, face, eyes, or nose. ? Avoid going out in public, follow guidance from your state and local health authorities. ? If you must go out in public, wear a cloth face covering or face mask. Make sure your mask covers your nose and mouth. ? Avoid crowded indoor spaces. Stay at least 6 feet (2 meters) away from others. ? Disinfect objects and surfaces that are frequently touched every day. This may include:  Counters and tables.  Doorknobs and light switches.  Sinks and faucets.  Electronics, such as phones, remote controls, keyboards, computers, and tablets. To protect others: If you have symptoms of COVID-19, take steps to prevent the virus from spreading to others.  If you think you have a COVID-19 infection, contact your health care provider right away. Tell your health care team that you think you may have a COVID-19 infection.  Stay home. Leave your house only to seek medical care. Do not use public transport.  Do not travel while you are sick.  Wash your hands often with soap and water for 20 seconds. If soap and water are not available, use alcohol-based hand sanitizer.  Stay away from other members of your household. Let healthy household members care for children and pets, if possible. If you have to care for children or pets, wash your hands often and  wear a mask. If possible, stay in your own room, separate from others. Use a different bathroom.  Make sure that all people in your household wash their hands well and often.  Cough or sneeze into a tissue or your sleeve or elbow. Do not cough or sneeze into your hand or into the air.  Wear a cloth face covering or face mask. Make sure your mask covers your nose and mouth. Where to find more information  Centers for Disease Control and Prevention: PurpleGadgets.be  World Health Organization: https://www.castaneda.info/ Contact a health care provider if:  You live in or have traveled to an area where COVID-19 is a risk and you have symptoms of the infection.  You have had contact  with someone who has COVID-19 and you have symptoms of the infection. Get help right away if:  You have trouble breathing.  You have pain or pressure in your chest.  You have confusion.  You have bluish lips and fingernails.  You have difficulty waking from sleep.  You have symptoms that get worse. These symptoms may represent a serious problem that is an emergency. Do not wait to see if the symptoms will go away. Get medical help right away. Call your local emergency services (911 in the [place] Do not drive yourself to the hospital. Let the emergency medical personnel know if you think you have COVID-19. Summary  COVID-19 is a respiratory infection that is caused by a virus. It is also known as coronavirus disease or novel coronavirus. It can cause serious infections, such as pneumonia, acute respiratory distress syndrome, acute respiratory failure, or sepsis.  The virus that causes COVID-19 is contagious. This means that it can spread from person to person through droplets from breathing, speaking, singing, coughing, or sneezing.  You are more likely to develop a serious illness if you are 37 years of age or older, have a weak immune system, live in a nursing home,  or have chronic disease.  There is no medicine to treat COVID-19. Your health care provider will talk with you about ways to treat your symptoms.  Take steps to protect yourself and others from infection. Wash your hands often and disinfect objects and surfaces that are frequently touched every day. Stay away from people who are sick and wear a mask if you are sick. This information is not intended to replace advice given to you by your health care provider. Make sure you discuss any questions you have with your health care provider. Document Revised: 06/04/2019 Document Reviewed: 09/10/2018 Elsevier Patient Education  2020 Real ibuprofen/Advil/Aleve/Motrin/Goody Powders/Naproxen/BC powders/Meloxicam/Diclofenac/Indomethacin and other Nonsteroidal anti-inflammatory medications as these will make you more likely to bleed and can cause stomach ulcers, can also cause Kidney problems.   2) take prednisone and azithromycin as prescribed  3) decrease lisinopril/HCTZ to half a tablet daily  4) he was at [facility] for Covid from 07/19/19 thru 07/29/19 inclusive----- he completed Remdesevir (5 days) and steroids for 10 days at that time---He has gotten 3 days of Remdesevir as of today and is no longer Hypoxic----He does NOT additional Remdesevir at this time-
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
## 2019-09-13 NOTE — Progress Notes (Signed)
Patient scheduled for outpatient Remdesivir infusion at 5:30 on Tuesday 1/26 and Wednesday 1/27.  Please advise them to report to [facility] at [address].  Drive to the security guard and tell them you are here for an infusion. They will direct you to the front entrance where we will come and get you.  For questions call [phone].  Thanks

## 2019-09-14 ENCOUNTER — Ambulatory Visit (HOSPITAL_COMMUNITY): Payer: Medicare PPO

## 2019-09-15 ENCOUNTER — Ambulatory Visit (HOSPITAL_COMMUNITY): Payer: Medicare PPO

## 2019-09-17 ENCOUNTER — Other Ambulatory Visit: Payer: Self-pay

## 2019-09-17 NOTE — Patient Outreach (Signed)
[place] [place] [facility]) Care Management  09/17/2019  Randy Lee 1951/03/04 [id number]   EMMI- General Discharge RED ON EMMI ALERT Day # 1 Date:  09/16/19 Red Alert Reason:  Scheduled follow up? No  Outreach attempt: spoke with patient. He reports that he is doing good.  Addressed red alert. Patient states that he talked with his PCP office today but did not schedule follow up. Patient states he will call back and schedule a follow up.  Patient denies any problems with transportation and has all his medications.  Discussed COVID recovery.  He verbalized understanding and declines need for further follow up.    Plan: RN CM will close case.     Jone Baseman, RN, MSN [facility] Care Management Care Management Coordinator Direct Line [phone] Toll Free: [phone]  Fax: [phone]

## 2019-09-20 ENCOUNTER — Other Ambulatory Visit: Payer: Self-pay

## 2019-09-20 DIAGNOSIS — Z299 Encounter for prophylactic measures, unspecified: Secondary | ICD-10-CM | POA: Diagnosis not present

## 2019-09-20 DIAGNOSIS — Z6829 Body mass index (BMI) 29.0-29.9, adult: Secondary | ICD-10-CM | POA: Diagnosis not present

## 2019-09-20 DIAGNOSIS — E1165 Type 2 diabetes mellitus with hyperglycemia: Secondary | ICD-10-CM | POA: Diagnosis not present

## 2019-09-20 DIAGNOSIS — I10 Essential (primary) hypertension: Secondary | ICD-10-CM | POA: Diagnosis not present

## 2019-09-20 DIAGNOSIS — J189 Pneumonia, unspecified organism: Secondary | ICD-10-CM | POA: Diagnosis not present

## 2019-09-20 DIAGNOSIS — C61 Malignant neoplasm of prostate: Secondary | ICD-10-CM | POA: Diagnosis not present

## 2019-09-20 NOTE — Patient Outreach (Signed)
[place] [facility]) Care Management  09/20/2019  ATREYU GUDMUNDSON [birth year] [id number]   EMMI- General Discharge RED ON EMMI ALERT Day # 4 Date:  09/19/19 Red Alert Reason:  Scheduled Follow up? No Lost interest in things? Yes  Outreach attempt: spoke with patient.  He reports that he is doing good.  Addressed red alerts.  Patient states that he has a follow up appointment with his physician scheduled. Patient denies any problems with lost of interest in things.  PHQ-9= 0.  Patient denies current needs or further nurse follow up.     Plan: RN CM will close case.    Jone Baseman, RN, MSN [facility] Care Management Care Management Coordinator Direct Line [phone] Toll Free: [phone]  Fax: [phone]

## 2019-09-30 DIAGNOSIS — Z794 Long term (current) use of insulin: Secondary | ICD-10-CM | POA: Diagnosis not present

## 2019-09-30 DIAGNOSIS — E1140 Type 2 diabetes mellitus with diabetic neuropathy, unspecified: Secondary | ICD-10-CM | POA: Diagnosis not present

## 2019-09-30 DIAGNOSIS — Z800 Family history of malignant neoplasm of digestive organs: Secondary | ICD-10-CM | POA: Diagnosis not present

## 2019-09-30 DIAGNOSIS — Z79899 Other long term (current) drug therapy: Secondary | ICD-10-CM | POA: Diagnosis not present

## 2019-09-30 DIAGNOSIS — C61 Malignant neoplasm of prostate: Secondary | ICD-10-CM | POA: Diagnosis not present

## 2019-09-30 DIAGNOSIS — E7800 Pure hypercholesterolemia, unspecified: Secondary | ICD-10-CM | POA: Diagnosis not present

## 2019-09-30 DIAGNOSIS — H548 Legal blindness, as defined in USA: Secondary | ICD-10-CM | POA: Diagnosis not present

## 2019-09-30 DIAGNOSIS — I10 Essential (primary) hypertension: Secondary | ICD-10-CM | POA: Diagnosis not present

## 2019-09-30 DIAGNOSIS — H409 Unspecified glaucoma: Secondary | ICD-10-CM | POA: Diagnosis not present

## 2019-10-06 DIAGNOSIS — C61 Malignant neoplasm of prostate: Secondary | ICD-10-CM | POA: Diagnosis not present

## 2019-10-08 DIAGNOSIS — C61 Malignant neoplasm of prostate: Secondary | ICD-10-CM | POA: Diagnosis not present

## 2019-10-13 DIAGNOSIS — C61 Malignant neoplasm of prostate: Secondary | ICD-10-CM | POA: Diagnosis not present

## 2019-11-03 DIAGNOSIS — E7800 Pure hypercholesterolemia, unspecified: Secondary | ICD-10-CM | POA: Diagnosis not present

## 2019-11-03 DIAGNOSIS — E119 Type 2 diabetes mellitus without complications: Secondary | ICD-10-CM | POA: Diagnosis not present

## 2019-11-03 DIAGNOSIS — I10 Essential (primary) hypertension: Secondary | ICD-10-CM | POA: Diagnosis not present

## 2019-11-05 DIAGNOSIS — Z947 Corneal transplant status: Secondary | ICD-10-CM | POA: Diagnosis not present

## 2019-11-05 DIAGNOSIS — E1165 Type 2 diabetes mellitus with hyperglycemia: Secondary | ICD-10-CM | POA: Diagnosis not present

## 2019-11-05 DIAGNOSIS — C61 Malignant neoplasm of prostate: Secondary | ICD-10-CM | POA: Diagnosis not present

## 2019-11-05 DIAGNOSIS — R011 Cardiac murmur, unspecified: Secondary | ICD-10-CM | POA: Diagnosis not present

## 2019-11-05 DIAGNOSIS — H409 Unspecified glaucoma: Secondary | ICD-10-CM | POA: Diagnosis not present

## 2019-11-05 DIAGNOSIS — M159 Polyosteoarthritis, unspecified: Secondary | ICD-10-CM | POA: Diagnosis not present

## 2019-11-05 DIAGNOSIS — Z8616 Personal history of COVID-19: Secondary | ICD-10-CM | POA: Diagnosis not present

## 2019-11-05 DIAGNOSIS — E1142 Type 2 diabetes mellitus with diabetic polyneuropathy: Secondary | ICD-10-CM | POA: Diagnosis not present

## 2019-11-05 DIAGNOSIS — Z794 Long term (current) use of insulin: Secondary | ICD-10-CM | POA: Diagnosis not present

## 2019-11-05 DIAGNOSIS — Z6827 Body mass index (BMI) 27.0-27.9, adult: Secondary | ICD-10-CM | POA: Diagnosis not present

## 2019-11-05 DIAGNOSIS — H543 Unqualified visual loss, both eyes: Secondary | ICD-10-CM | POA: Diagnosis not present

## 2019-11-05 DIAGNOSIS — H547 Unspecified visual loss: Secondary | ICD-10-CM | POA: Diagnosis not present

## 2019-11-05 DIAGNOSIS — E785 Hyperlipidemia, unspecified: Secondary | ICD-10-CM | POA: Diagnosis not present

## 2019-11-05 DIAGNOSIS — I10 Essential (primary) hypertension: Secondary | ICD-10-CM | POA: Diagnosis not present

## 2019-11-13 DIAGNOSIS — R0789 Other chest pain: Secondary | ICD-10-CM | POA: Diagnosis not present

## 2019-11-13 DIAGNOSIS — I340 Nonrheumatic mitral (valve) insufficiency: Secondary | ICD-10-CM | POA: Diagnosis not present

## 2019-11-13 DIAGNOSIS — E7800 Pure hypercholesterolemia, unspecified: Secondary | ICD-10-CM | POA: Diagnosis not present

## 2019-11-13 DIAGNOSIS — I10 Essential (primary) hypertension: Secondary | ICD-10-CM | POA: Diagnosis not present

## 2019-11-13 DIAGNOSIS — E119 Type 2 diabetes mellitus without complications: Secondary | ICD-10-CM | POA: Diagnosis not present

## 2019-11-13 DIAGNOSIS — Z20822 Contact with and (suspected) exposure to covid-19: Secondary | ICD-10-CM | POA: Diagnosis not present

## 2019-11-13 DIAGNOSIS — C61 Malignant neoplasm of prostate: Secondary | ICD-10-CM | POA: Diagnosis not present

## 2019-11-13 DIAGNOSIS — I739 Peripheral vascular disease, unspecified: Secondary | ICD-10-CM | POA: Diagnosis not present

## 2019-11-13 DIAGNOSIS — R079 Chest pain, unspecified: Secondary | ICD-10-CM | POA: Diagnosis not present

## 2019-11-13 DIAGNOSIS — H409 Unspecified glaucoma: Secondary | ICD-10-CM | POA: Diagnosis not present

## 2019-11-14 DIAGNOSIS — I739 Peripheral vascular disease, unspecified: Secondary | ICD-10-CM | POA: Diagnosis not present

## 2019-11-14 DIAGNOSIS — I10 Essential (primary) hypertension: Secondary | ICD-10-CM | POA: Diagnosis not present

## 2019-11-14 DIAGNOSIS — R079 Chest pain, unspecified: Secondary | ICD-10-CM | POA: Diagnosis not present

## 2019-11-14 DIAGNOSIS — R0789 Other chest pain: Secondary | ICD-10-CM | POA: Diagnosis not present

## 2019-11-14 DIAGNOSIS — R0602 Shortness of breath: Secondary | ICD-10-CM | POA: Diagnosis not present

## 2019-11-18 DIAGNOSIS — M7071 Other bursitis of hip, right hip: Secondary | ICD-10-CM | POA: Diagnosis not present

## 2019-11-18 DIAGNOSIS — E1165 Type 2 diabetes mellitus with hyperglycemia: Secondary | ICD-10-CM | POA: Diagnosis not present

## 2019-11-18 DIAGNOSIS — M7070 Other bursitis of hip, unspecified hip: Secondary | ICD-10-CM | POA: Diagnosis not present

## 2019-11-18 DIAGNOSIS — Z299 Encounter for prophylactic measures, unspecified: Secondary | ICD-10-CM | POA: Diagnosis not present

## 2019-11-18 DIAGNOSIS — Z7689 Persons encountering health services in other specified circumstances: Secondary | ICD-10-CM | POA: Diagnosis not present

## 2019-11-18 DIAGNOSIS — I10 Essential (primary) hypertension: Secondary | ICD-10-CM | POA: Diagnosis not present

## 2019-11-18 DIAGNOSIS — E1142 Type 2 diabetes mellitus with diabetic polyneuropathy: Secondary | ICD-10-CM | POA: Diagnosis not present

## 2019-11-30 DIAGNOSIS — C61 Malignant neoplasm of prostate: Secondary | ICD-10-CM | POA: Diagnosis not present

## 2019-12-08 DIAGNOSIS — C61 Malignant neoplasm of prostate: Secondary | ICD-10-CM | POA: Diagnosis not present

## 2019-12-13 DIAGNOSIS — I10 Essential (primary) hypertension: Secondary | ICD-10-CM | POA: Diagnosis not present

## 2019-12-13 DIAGNOSIS — E7800 Pure hypercholesterolemia, unspecified: Secondary | ICD-10-CM | POA: Diagnosis not present

## 2019-12-13 DIAGNOSIS — E119 Type 2 diabetes mellitus without complications: Secondary | ICD-10-CM | POA: Diagnosis not present

## 2019-12-15 DIAGNOSIS — C61 Malignant neoplasm of prostate: Secondary | ICD-10-CM | POA: Diagnosis not present

## 2019-12-16 DIAGNOSIS — C61 Malignant neoplasm of prostate: Secondary | ICD-10-CM | POA: Diagnosis not present

## 2019-12-17 DIAGNOSIS — C61 Malignant neoplasm of prostate: Secondary | ICD-10-CM | POA: Diagnosis not present

## 2019-12-20 DIAGNOSIS — R300 Dysuria: Secondary | ICD-10-CM | POA: Diagnosis not present

## 2019-12-20 DIAGNOSIS — C61 Malignant neoplasm of prostate: Secondary | ICD-10-CM | POA: Diagnosis not present

## 2019-12-20 DIAGNOSIS — R3912 Poor urinary stream: Secondary | ICD-10-CM | POA: Diagnosis not present

## 2019-12-20 DIAGNOSIS — Z510 Encounter for antineoplastic radiation therapy: Secondary | ICD-10-CM | POA: Diagnosis not present

## 2019-12-20 DIAGNOSIS — R339 Retention of urine, unspecified: Secondary | ICD-10-CM | POA: Diagnosis not present

## 2019-12-21 DIAGNOSIS — R3914 Feeling of incomplete bladder emptying: Secondary | ICD-10-CM | POA: Diagnosis not present

## 2019-12-21 DIAGNOSIS — Z510 Encounter for antineoplastic radiation therapy: Secondary | ICD-10-CM | POA: Diagnosis not present

## 2019-12-21 DIAGNOSIS — C61 Malignant neoplasm of prostate: Secondary | ICD-10-CM | POA: Diagnosis not present

## 2019-12-21 DIAGNOSIS — R339 Retention of urine, unspecified: Secondary | ICD-10-CM | POA: Diagnosis not present

## 2019-12-21 DIAGNOSIS — R3912 Poor urinary stream: Secondary | ICD-10-CM | POA: Diagnosis not present

## 2019-12-21 DIAGNOSIS — R300 Dysuria: Secondary | ICD-10-CM | POA: Diagnosis not present

## 2019-12-22 DIAGNOSIS — R339 Retention of urine, unspecified: Secondary | ICD-10-CM | POA: Diagnosis not present

## 2019-12-22 DIAGNOSIS — R3912 Poor urinary stream: Secondary | ICD-10-CM | POA: Diagnosis not present

## 2019-12-22 DIAGNOSIS — C61 Malignant neoplasm of prostate: Secondary | ICD-10-CM | POA: Diagnosis not present

## 2019-12-22 DIAGNOSIS — Z510 Encounter for antineoplastic radiation therapy: Secondary | ICD-10-CM | POA: Diagnosis not present

## 2019-12-22 DIAGNOSIS — R300 Dysuria: Secondary | ICD-10-CM | POA: Diagnosis not present

## 2019-12-23 DIAGNOSIS — R300 Dysuria: Secondary | ICD-10-CM | POA: Diagnosis not present

## 2019-12-23 DIAGNOSIS — R339 Retention of urine, unspecified: Secondary | ICD-10-CM | POA: Diagnosis not present

## 2019-12-23 DIAGNOSIS — C61 Malignant neoplasm of prostate: Secondary | ICD-10-CM | POA: Diagnosis not present

## 2019-12-23 DIAGNOSIS — R3912 Poor urinary stream: Secondary | ICD-10-CM | POA: Diagnosis not present

## 2019-12-23 DIAGNOSIS — Z510 Encounter for antineoplastic radiation therapy: Secondary | ICD-10-CM | POA: Diagnosis not present

## 2019-12-24 DIAGNOSIS — R300 Dysuria: Secondary | ICD-10-CM | POA: Diagnosis not present

## 2019-12-24 DIAGNOSIS — R3914 Feeling of incomplete bladder emptying: Secondary | ICD-10-CM | POA: Diagnosis not present

## 2019-12-24 DIAGNOSIS — I10 Essential (primary) hypertension: Secondary | ICD-10-CM | POA: Diagnosis not present

## 2019-12-24 DIAGNOSIS — C61 Malignant neoplasm of prostate: Secondary | ICD-10-CM | POA: Diagnosis not present

## 2019-12-24 DIAGNOSIS — E7800 Pure hypercholesterolemia, unspecified: Secondary | ICD-10-CM | POA: Diagnosis not present

## 2019-12-24 DIAGNOSIS — E119 Type 2 diabetes mellitus without complications: Secondary | ICD-10-CM | POA: Diagnosis not present

## 2019-12-24 DIAGNOSIS — Z794 Long term (current) use of insulin: Secondary | ICD-10-CM | POA: Diagnosis not present

## 2019-12-24 DIAGNOSIS — R310 Gross hematuria: Secondary | ICD-10-CM | POA: Diagnosis not present

## 2019-12-24 DIAGNOSIS — R3912 Poor urinary stream: Secondary | ICD-10-CM | POA: Diagnosis not present

## 2019-12-24 DIAGNOSIS — Z510 Encounter for antineoplastic radiation therapy: Secondary | ICD-10-CM | POA: Diagnosis not present

## 2019-12-24 DIAGNOSIS — R339 Retention of urine, unspecified: Secondary | ICD-10-CM | POA: Diagnosis not present

## 2019-12-24 DIAGNOSIS — R319 Hematuria, unspecified: Secondary | ICD-10-CM | POA: Diagnosis not present

## 2019-12-27 DIAGNOSIS — R300 Dysuria: Secondary | ICD-10-CM | POA: Diagnosis not present

## 2019-12-27 DIAGNOSIS — R3912 Poor urinary stream: Secondary | ICD-10-CM | POA: Diagnosis not present

## 2019-12-27 DIAGNOSIS — Z510 Encounter for antineoplastic radiation therapy: Secondary | ICD-10-CM | POA: Diagnosis not present

## 2019-12-27 DIAGNOSIS — C61 Malignant neoplasm of prostate: Secondary | ICD-10-CM | POA: Diagnosis not present

## 2019-12-27 DIAGNOSIS — R339 Retention of urine, unspecified: Secondary | ICD-10-CM | POA: Diagnosis not present

## 2019-12-28 DIAGNOSIS — R3912 Poor urinary stream: Secondary | ICD-10-CM | POA: Diagnosis not present

## 2019-12-28 DIAGNOSIS — R300 Dysuria: Secondary | ICD-10-CM | POA: Diagnosis not present

## 2019-12-28 DIAGNOSIS — Z510 Encounter for antineoplastic radiation therapy: Secondary | ICD-10-CM | POA: Diagnosis not present

## 2019-12-28 DIAGNOSIS — C61 Malignant neoplasm of prostate: Secondary | ICD-10-CM | POA: Diagnosis not present

## 2019-12-28 DIAGNOSIS — R339 Retention of urine, unspecified: Secondary | ICD-10-CM | POA: Diagnosis not present

## 2019-12-29 DIAGNOSIS — C61 Malignant neoplasm of prostate: Secondary | ICD-10-CM | POA: Diagnosis not present

## 2019-12-29 DIAGNOSIS — R339 Retention of urine, unspecified: Secondary | ICD-10-CM | POA: Diagnosis not present

## 2019-12-29 DIAGNOSIS — R3912 Poor urinary stream: Secondary | ICD-10-CM | POA: Diagnosis not present

## 2019-12-29 DIAGNOSIS — Z510 Encounter for antineoplastic radiation therapy: Secondary | ICD-10-CM | POA: Diagnosis not present

## 2019-12-29 DIAGNOSIS — R300 Dysuria: Secondary | ICD-10-CM | POA: Diagnosis not present

## 2019-12-30 DIAGNOSIS — C61 Malignant neoplasm of prostate: Secondary | ICD-10-CM | POA: Diagnosis not present

## 2019-12-30 DIAGNOSIS — R3912 Poor urinary stream: Secondary | ICD-10-CM | POA: Diagnosis not present

## 2019-12-30 DIAGNOSIS — R339 Retention of urine, unspecified: Secondary | ICD-10-CM | POA: Diagnosis not present

## 2019-12-30 DIAGNOSIS — R300 Dysuria: Secondary | ICD-10-CM | POA: Diagnosis not present

## 2019-12-30 DIAGNOSIS — Z510 Encounter for antineoplastic radiation therapy: Secondary | ICD-10-CM | POA: Diagnosis not present

## 2019-12-31 DIAGNOSIS — R300 Dysuria: Secondary | ICD-10-CM | POA: Diagnosis not present

## 2019-12-31 DIAGNOSIS — R339 Retention of urine, unspecified: Secondary | ICD-10-CM | POA: Diagnosis not present

## 2019-12-31 DIAGNOSIS — C61 Malignant neoplasm of prostate: Secondary | ICD-10-CM | POA: Diagnosis not present

## 2019-12-31 DIAGNOSIS — R3912 Poor urinary stream: Secondary | ICD-10-CM | POA: Diagnosis not present

## 2019-12-31 DIAGNOSIS — Z510 Encounter for antineoplastic radiation therapy: Secondary | ICD-10-CM | POA: Diagnosis not present

## 2020-01-03 DIAGNOSIS — Z510 Encounter for antineoplastic radiation therapy: Secondary | ICD-10-CM | POA: Diagnosis not present

## 2020-01-03 DIAGNOSIS — R3912 Poor urinary stream: Secondary | ICD-10-CM | POA: Diagnosis not present

## 2020-01-03 DIAGNOSIS — C61 Malignant neoplasm of prostate: Secondary | ICD-10-CM | POA: Diagnosis not present

## 2020-01-03 DIAGNOSIS — R300 Dysuria: Secondary | ICD-10-CM | POA: Diagnosis not present

## 2020-01-03 DIAGNOSIS — R339 Retention of urine, unspecified: Secondary | ICD-10-CM | POA: Diagnosis not present

## 2020-01-04 DIAGNOSIS — R300 Dysuria: Secondary | ICD-10-CM | POA: Diagnosis not present

## 2020-01-04 DIAGNOSIS — R3912 Poor urinary stream: Secondary | ICD-10-CM | POA: Diagnosis not present

## 2020-01-04 DIAGNOSIS — C61 Malignant neoplasm of prostate: Secondary | ICD-10-CM | POA: Diagnosis not present

## 2020-01-04 DIAGNOSIS — Z510 Encounter for antineoplastic radiation therapy: Secondary | ICD-10-CM | POA: Diagnosis not present

## 2020-01-04 DIAGNOSIS — R339 Retention of urine, unspecified: Secondary | ICD-10-CM | POA: Diagnosis not present

## 2020-01-05 DIAGNOSIS — R3912 Poor urinary stream: Secondary | ICD-10-CM | POA: Diagnosis not present

## 2020-01-05 DIAGNOSIS — R300 Dysuria: Secondary | ICD-10-CM | POA: Diagnosis not present

## 2020-01-05 DIAGNOSIS — Z510 Encounter for antineoplastic radiation therapy: Secondary | ICD-10-CM | POA: Diagnosis not present

## 2020-01-05 DIAGNOSIS — C61 Malignant neoplasm of prostate: Secondary | ICD-10-CM | POA: Diagnosis not present

## 2020-01-05 DIAGNOSIS — R339 Retention of urine, unspecified: Secondary | ICD-10-CM | POA: Diagnosis not present

## 2020-01-06 DIAGNOSIS — R339 Retention of urine, unspecified: Secondary | ICD-10-CM | POA: Diagnosis not present

## 2020-01-06 DIAGNOSIS — C61 Malignant neoplasm of prostate: Secondary | ICD-10-CM | POA: Diagnosis not present

## 2020-01-06 DIAGNOSIS — Z510 Encounter for antineoplastic radiation therapy: Secondary | ICD-10-CM | POA: Diagnosis not present

## 2020-01-06 DIAGNOSIS — R3912 Poor urinary stream: Secondary | ICD-10-CM | POA: Diagnosis not present

## 2020-01-06 DIAGNOSIS — R300 Dysuria: Secondary | ICD-10-CM | POA: Diagnosis not present

## 2020-01-07 DIAGNOSIS — C61 Malignant neoplasm of prostate: Secondary | ICD-10-CM | POA: Diagnosis not present

## 2020-01-07 DIAGNOSIS — R300 Dysuria: Secondary | ICD-10-CM | POA: Diagnosis not present

## 2020-01-07 DIAGNOSIS — Z510 Encounter for antineoplastic radiation therapy: Secondary | ICD-10-CM | POA: Diagnosis not present

## 2020-01-07 DIAGNOSIS — R339 Retention of urine, unspecified: Secondary | ICD-10-CM | POA: Diagnosis not present

## 2020-01-07 DIAGNOSIS — R3912 Poor urinary stream: Secondary | ICD-10-CM | POA: Diagnosis not present

## 2020-01-10 DIAGNOSIS — R339 Retention of urine, unspecified: Secondary | ICD-10-CM | POA: Diagnosis not present

## 2020-01-10 DIAGNOSIS — R300 Dysuria: Secondary | ICD-10-CM | POA: Diagnosis not present

## 2020-01-10 DIAGNOSIS — Z510 Encounter for antineoplastic radiation therapy: Secondary | ICD-10-CM | POA: Diagnosis not present

## 2020-01-10 DIAGNOSIS — C61 Malignant neoplasm of prostate: Secondary | ICD-10-CM | POA: Diagnosis not present

## 2020-01-10 DIAGNOSIS — R3912 Poor urinary stream: Secondary | ICD-10-CM | POA: Diagnosis not present

## 2020-01-11 DIAGNOSIS — R3912 Poor urinary stream: Secondary | ICD-10-CM | POA: Diagnosis not present

## 2020-01-11 DIAGNOSIS — Z510 Encounter for antineoplastic radiation therapy: Secondary | ICD-10-CM | POA: Diagnosis not present

## 2020-01-11 DIAGNOSIS — C61 Malignant neoplasm of prostate: Secondary | ICD-10-CM | POA: Diagnosis not present

## 2020-01-11 DIAGNOSIS — R339 Retention of urine, unspecified: Secondary | ICD-10-CM | POA: Diagnosis not present

## 2020-01-11 DIAGNOSIS — R300 Dysuria: Secondary | ICD-10-CM | POA: Diagnosis not present

## 2020-01-12 DIAGNOSIS — R300 Dysuria: Secondary | ICD-10-CM | POA: Diagnosis not present

## 2020-01-12 DIAGNOSIS — C61 Malignant neoplasm of prostate: Secondary | ICD-10-CM | POA: Diagnosis not present

## 2020-01-12 DIAGNOSIS — R339 Retention of urine, unspecified: Secondary | ICD-10-CM | POA: Diagnosis not present

## 2020-01-12 DIAGNOSIS — R3912 Poor urinary stream: Secondary | ICD-10-CM | POA: Diagnosis not present

## 2020-01-12 DIAGNOSIS — Z510 Encounter for antineoplastic radiation therapy: Secondary | ICD-10-CM | POA: Diagnosis not present

## 2020-01-13 DIAGNOSIS — R300 Dysuria: Secondary | ICD-10-CM | POA: Diagnosis not present

## 2020-01-13 DIAGNOSIS — R339 Retention of urine, unspecified: Secondary | ICD-10-CM | POA: Diagnosis not present

## 2020-01-13 DIAGNOSIS — C61 Malignant neoplasm of prostate: Secondary | ICD-10-CM | POA: Diagnosis not present

## 2020-01-13 DIAGNOSIS — Z510 Encounter for antineoplastic radiation therapy: Secondary | ICD-10-CM | POA: Diagnosis not present

## 2020-01-13 DIAGNOSIS — R3912 Poor urinary stream: Secondary | ICD-10-CM | POA: Diagnosis not present

## 2020-01-14 DIAGNOSIS — R300 Dysuria: Secondary | ICD-10-CM | POA: Diagnosis not present

## 2020-01-14 DIAGNOSIS — C61 Malignant neoplasm of prostate: Secondary | ICD-10-CM | POA: Diagnosis not present

## 2020-01-14 DIAGNOSIS — Z510 Encounter for antineoplastic radiation therapy: Secondary | ICD-10-CM | POA: Diagnosis not present

## 2020-01-14 DIAGNOSIS — R3912 Poor urinary stream: Secondary | ICD-10-CM | POA: Diagnosis not present

## 2020-01-14 DIAGNOSIS — R339 Retention of urine, unspecified: Secondary | ICD-10-CM | POA: Diagnosis not present

## 2020-01-18 DIAGNOSIS — K649 Unspecified hemorrhoids: Secondary | ICD-10-CM | POA: Diagnosis not present

## 2020-01-18 DIAGNOSIS — Z510 Encounter for antineoplastic radiation therapy: Secondary | ICD-10-CM | POA: Diagnosis not present

## 2020-01-18 DIAGNOSIS — Z960 Presence of urogenital implants: Secondary | ICD-10-CM | POA: Diagnosis not present

## 2020-01-18 DIAGNOSIS — C61 Malignant neoplasm of prostate: Secondary | ICD-10-CM | POA: Diagnosis not present

## 2020-01-19 DIAGNOSIS — Z960 Presence of urogenital implants: Secondary | ICD-10-CM | POA: Diagnosis not present

## 2020-01-19 DIAGNOSIS — K649 Unspecified hemorrhoids: Secondary | ICD-10-CM | POA: Diagnosis not present

## 2020-01-19 DIAGNOSIS — Z510 Encounter for antineoplastic radiation therapy: Secondary | ICD-10-CM | POA: Diagnosis not present

## 2020-01-19 DIAGNOSIS — C61 Malignant neoplasm of prostate: Secondary | ICD-10-CM | POA: Diagnosis not present

## 2020-01-20 DIAGNOSIS — Z960 Presence of urogenital implants: Secondary | ICD-10-CM | POA: Diagnosis not present

## 2020-01-20 DIAGNOSIS — Z510 Encounter for antineoplastic radiation therapy: Secondary | ICD-10-CM | POA: Diagnosis not present

## 2020-01-20 DIAGNOSIS — C61 Malignant neoplasm of prostate: Secondary | ICD-10-CM | POA: Diagnosis not present

## 2020-01-20 DIAGNOSIS — K649 Unspecified hemorrhoids: Secondary | ICD-10-CM | POA: Diagnosis not present

## 2020-01-21 DIAGNOSIS — Z510 Encounter for antineoplastic radiation therapy: Secondary | ICD-10-CM | POA: Diagnosis not present

## 2020-01-21 DIAGNOSIS — K649 Unspecified hemorrhoids: Secondary | ICD-10-CM | POA: Diagnosis not present

## 2020-01-21 DIAGNOSIS — C61 Malignant neoplasm of prostate: Secondary | ICD-10-CM | POA: Diagnosis not present

## 2020-01-21 DIAGNOSIS — Z960 Presence of urogenital implants: Secondary | ICD-10-CM | POA: Diagnosis not present

## 2020-01-24 DIAGNOSIS — Z960 Presence of urogenital implants: Secondary | ICD-10-CM | POA: Diagnosis not present

## 2020-01-24 DIAGNOSIS — Z510 Encounter for antineoplastic radiation therapy: Secondary | ICD-10-CM | POA: Diagnosis not present

## 2020-01-24 DIAGNOSIS — K649 Unspecified hemorrhoids: Secondary | ICD-10-CM | POA: Diagnosis not present

## 2020-01-24 DIAGNOSIS — C61 Malignant neoplasm of prostate: Secondary | ICD-10-CM | POA: Diagnosis not present

## 2020-01-27 DIAGNOSIS — N390 Urinary tract infection, site not specified: Secondary | ICD-10-CM | POA: Diagnosis not present

## 2020-01-27 DIAGNOSIS — Z8546 Personal history of malignant neoplasm of prostate: Secondary | ICD-10-CM | POA: Diagnosis not present

## 2020-01-27 DIAGNOSIS — Z794 Long term (current) use of insulin: Secondary | ICD-10-CM | POA: Diagnosis not present

## 2020-01-27 DIAGNOSIS — E7800 Pure hypercholesterolemia, unspecified: Secondary | ICD-10-CM | POA: Diagnosis not present

## 2020-01-27 DIAGNOSIS — I10 Essential (primary) hypertension: Secondary | ICD-10-CM | POA: Diagnosis not present

## 2020-01-27 DIAGNOSIS — E119 Type 2 diabetes mellitus without complications: Secondary | ICD-10-CM | POA: Diagnosis not present

## 2020-01-27 DIAGNOSIS — T83511A Infection and inflammatory reaction due to indwelling urethral catheter, initial encounter: Secondary | ICD-10-CM | POA: Diagnosis not present

## 2020-01-27 DIAGNOSIS — T83098A Other mechanical complication of other indwelling urethral catheter, initial encounter: Secondary | ICD-10-CM | POA: Diagnosis not present

## 2020-02-03 DIAGNOSIS — I10 Essential (primary) hypertension: Secondary | ICD-10-CM | POA: Diagnosis not present

## 2020-02-03 DIAGNOSIS — R338 Other retention of urine: Secondary | ICD-10-CM | POA: Diagnosis not present

## 2020-02-03 DIAGNOSIS — E7800 Pure hypercholesterolemia, unspecified: Secondary | ICD-10-CM | POA: Diagnosis not present

## 2020-02-03 DIAGNOSIS — N401 Enlarged prostate with lower urinary tract symptoms: Secondary | ICD-10-CM | POA: Diagnosis not present

## 2020-02-03 DIAGNOSIS — N3000 Acute cystitis without hematuria: Secondary | ICD-10-CM | POA: Diagnosis not present

## 2020-02-03 DIAGNOSIS — C61 Malignant neoplasm of prostate: Secondary | ICD-10-CM | POA: Diagnosis not present

## 2020-02-03 DIAGNOSIS — E119 Type 2 diabetes mellitus without complications: Secondary | ICD-10-CM | POA: Diagnosis not present

## 2020-02-03 DIAGNOSIS — R339 Retention of urine, unspecified: Secondary | ICD-10-CM | POA: Diagnosis not present

## 2020-02-03 DIAGNOSIS — Z794 Long term (current) use of insulin: Secondary | ICD-10-CM | POA: Diagnosis not present

## 2020-02-10 DIAGNOSIS — R339 Retention of urine, unspecified: Secondary | ICD-10-CM | POA: Diagnosis not present

## 2020-02-11 DIAGNOSIS — R338 Other retention of urine: Secondary | ICD-10-CM | POA: Diagnosis not present

## 2020-02-16 DIAGNOSIS — I10 Essential (primary) hypertension: Secondary | ICD-10-CM | POA: Diagnosis not present

## 2020-02-16 DIAGNOSIS — E119 Type 2 diabetes mellitus without complications: Secondary | ICD-10-CM | POA: Diagnosis not present

## 2020-02-16 DIAGNOSIS — E7800 Pure hypercholesterolemia, unspecified: Secondary | ICD-10-CM | POA: Diagnosis not present

## 2020-02-25 DIAGNOSIS — E1142 Type 2 diabetes mellitus with diabetic polyneuropathy: Secondary | ICD-10-CM | POA: Diagnosis not present

## 2020-02-25 DIAGNOSIS — E1165 Type 2 diabetes mellitus with hyperglycemia: Secondary | ICD-10-CM | POA: Diagnosis not present

## 2020-02-25 DIAGNOSIS — Z1331 Encounter for screening for depression: Secondary | ICD-10-CM | POA: Diagnosis not present

## 2020-02-25 DIAGNOSIS — R5383 Other fatigue: Secondary | ICD-10-CM | POA: Diagnosis not present

## 2020-02-25 DIAGNOSIS — Z7189 Other specified counseling: Secondary | ICD-10-CM | POA: Diagnosis not present

## 2020-02-25 DIAGNOSIS — Z79899 Other long term (current) drug therapy: Secondary | ICD-10-CM | POA: Diagnosis not present

## 2020-02-25 DIAGNOSIS — Z0000 Encounter for general adult medical examination without abnormal findings: Secondary | ICD-10-CM | POA: Diagnosis not present

## 2020-02-25 DIAGNOSIS — E7800 Pure hypercholesterolemia, unspecified: Secondary | ICD-10-CM | POA: Diagnosis not present

## 2020-02-25 DIAGNOSIS — I10 Essential (primary) hypertension: Secondary | ICD-10-CM | POA: Diagnosis not present

## 2020-02-25 DIAGNOSIS — M7070 Other bursitis of hip, unspecified hip: Secondary | ICD-10-CM | POA: Diagnosis not present

## 2020-03-07 DIAGNOSIS — R338 Other retention of urine: Secondary | ICD-10-CM | POA: Diagnosis not present

## 2020-03-07 DIAGNOSIS — R339 Retention of urine, unspecified: Secondary | ICD-10-CM | POA: Diagnosis not present

## 2020-03-09 DIAGNOSIS — R339 Retention of urine, unspecified: Secondary | ICD-10-CM | POA: Diagnosis not present

## 2020-03-10 DIAGNOSIS — R339 Retention of urine, unspecified: Secondary | ICD-10-CM | POA: Diagnosis not present

## 2020-03-17 DIAGNOSIS — I10 Essential (primary) hypertension: Secondary | ICD-10-CM | POA: Diagnosis not present

## 2020-03-17 DIAGNOSIS — E119 Type 2 diabetes mellitus without complications: Secondary | ICD-10-CM | POA: Diagnosis not present

## 2020-03-17 DIAGNOSIS — E7800 Pure hypercholesterolemia, unspecified: Secondary | ICD-10-CM | POA: Diagnosis not present
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
## 2020-03-27 DIAGNOSIS — R339 Retention of urine, unspecified: Secondary | ICD-10-CM | POA: Diagnosis not present

## 2020-03-28 DIAGNOSIS — R339 Retention of urine, unspecified: Secondary | ICD-10-CM | POA: Diagnosis not present

## 2020-04-05 DIAGNOSIS — E7800 Pure hypercholesterolemia, unspecified: Secondary | ICD-10-CM | POA: Diagnosis not present

## 2020-04-05 DIAGNOSIS — I10 Essential (primary) hypertension: Secondary | ICD-10-CM | POA: Diagnosis not present

## 2020-04-05 DIAGNOSIS — E119 Type 2 diabetes mellitus without complications: Secondary | ICD-10-CM | POA: Diagnosis not present

## 2020-04-06 DIAGNOSIS — R339 Retention of urine, unspecified: Secondary | ICD-10-CM | POA: Diagnosis not present

## 2020-04-06 DIAGNOSIS — C61 Malignant neoplasm of prostate: Secondary | ICD-10-CM | POA: Diagnosis not present

## 2020-04-06 DIAGNOSIS — N3281 Overactive bladder: Secondary | ICD-10-CM | POA: Diagnosis not present
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
## 2020-05-18 DIAGNOSIS — E119 Type 2 diabetes mellitus without complications: Secondary | ICD-10-CM | POA: Diagnosis not present

## 2020-05-18 DIAGNOSIS — E7800 Pure hypercholesterolemia, unspecified: Secondary | ICD-10-CM | POA: Diagnosis not present

## 2020-05-18 DIAGNOSIS — I10 Essential (primary) hypertension: Secondary | ICD-10-CM | POA: Diagnosis not present

## 2020-05-26 DIAGNOSIS — I739 Peripheral vascular disease, unspecified: Secondary | ICD-10-CM | POA: Diagnosis not present

## 2020-05-26 DIAGNOSIS — C61 Malignant neoplasm of prostate: Secondary | ICD-10-CM | POA: Diagnosis not present

## 2020-05-26 DIAGNOSIS — E1165 Type 2 diabetes mellitus with hyperglycemia: Secondary | ICD-10-CM | POA: Diagnosis not present

## 2020-05-26 DIAGNOSIS — Z299 Encounter for prophylactic measures, unspecified: Secondary | ICD-10-CM | POA: Diagnosis not present

## 2020-05-26 DIAGNOSIS — I10 Essential (primary) hypertension: Secondary | ICD-10-CM | POA: Diagnosis not present

## 2020-06-16 DIAGNOSIS — I10 Essential (primary) hypertension: Secondary | ICD-10-CM | POA: Diagnosis not present

## 2020-06-16 DIAGNOSIS — E7800 Pure hypercholesterolemia, unspecified: Secondary | ICD-10-CM | POA: Diagnosis not present

## 2020-06-16 DIAGNOSIS — E119 Type 2 diabetes mellitus without complications: Secondary | ICD-10-CM | POA: Diagnosis not present

## 2020-07-06 DIAGNOSIS — Z947 Corneal transplant status: Secondary | ICD-10-CM | POA: Diagnosis not present

## 2020-07-06 DIAGNOSIS — E119 Type 2 diabetes mellitus without complications: Secondary | ICD-10-CM | POA: Diagnosis not present

## 2020-07-06 DIAGNOSIS — H401110 Primary open-angle glaucoma, right eye, stage unspecified: Secondary | ICD-10-CM | POA: Diagnosis not present

## 2020-07-18 DIAGNOSIS — I10 Essential (primary) hypertension: Secondary | ICD-10-CM | POA: Diagnosis not present

## 2020-07-18 DIAGNOSIS — E7800 Pure hypercholesterolemia, unspecified: Secondary | ICD-10-CM | POA: Diagnosis not present

## 2020-07-18 DIAGNOSIS — E119 Type 2 diabetes mellitus without complications: Secondary | ICD-10-CM | POA: Diagnosis not present

## 2020-07-28 DIAGNOSIS — C61 Malignant neoplasm of prostate: Secondary | ICD-10-CM | POA: Diagnosis not present

## 2020-08-04 DIAGNOSIS — R339 Retention of urine, unspecified: Secondary | ICD-10-CM | POA: Diagnosis not present

## 2020-08-05 DIAGNOSIS — R339 Retention of urine, unspecified: Secondary | ICD-10-CM | POA: Diagnosis not present

## 2020-08-07 DIAGNOSIS — C61 Malignant neoplasm of prostate: Secondary | ICD-10-CM | POA: Diagnosis not present

## 2020-08-07 DIAGNOSIS — R350 Frequency of micturition: Secondary | ICD-10-CM | POA: Diagnosis not present

## 2020-08-07 DIAGNOSIS — R3912 Poor urinary stream: Secondary | ICD-10-CM | POA: Diagnosis not present

## 2020-08-07 DIAGNOSIS — R339 Retention of urine, unspecified: Secondary | ICD-10-CM | POA: Diagnosis not present

## 2020-08-18 DIAGNOSIS — I10 Essential (primary) hypertension: Secondary | ICD-10-CM | POA: Diagnosis not present

## 2020-08-18 DIAGNOSIS — E1165 Type 2 diabetes mellitus with hyperglycemia: Secondary | ICD-10-CM | POA: Diagnosis not present

## 2020-08-18 DIAGNOSIS — K219 Gastro-esophageal reflux disease without esophagitis: Secondary | ICD-10-CM | POA: Diagnosis not present

## 2020-09-28 ENCOUNTER — Other Ambulatory Visit: Payer: Self-pay

## 2020-09-28 ENCOUNTER — Encounter (HOSPITAL_COMMUNITY): Payer: Self-pay | Admitting: Emergency Medicine

## 2020-09-28 ENCOUNTER — Emergency Department (HOSPITAL_COMMUNITY): Payer: Medicare PPO

## 2020-09-28 ENCOUNTER — Emergency Department (HOSPITAL_COMMUNITY)
Admission: EM | Admit: 2020-09-28 | Discharge: 2020-09-29 | Disposition: A | Discharge status: Home / Self Care | Payer: Medicare PPO | Attending: Emergency Medicine | Admitting: Emergency Medicine

## 2020-09-28 DIAGNOSIS — X58XXXA Exposure to other specified factors, initial encounter: Secondary | ICD-10-CM | POA: Diagnosis not present

## 2020-09-28 DIAGNOSIS — S299XXA Unspecified injury of thorax, initial encounter: Secondary | ICD-10-CM | POA: Diagnosis present

## 2020-09-28 DIAGNOSIS — Z859 Personal history of malignant neoplasm, unspecified: Secondary | ICD-10-CM | POA: Diagnosis not present

## 2020-09-28 DIAGNOSIS — Z8616 Personal history of COVID-19: Secondary | ICD-10-CM | POA: Diagnosis not present

## 2020-09-28 DIAGNOSIS — R079 Chest pain, unspecified: Secondary | ICD-10-CM

## 2020-09-28 DIAGNOSIS — Z7984 Long term (current) use of oral hypoglycemic drugs: Secondary | ICD-10-CM | POA: Diagnosis not present

## 2020-09-28 DIAGNOSIS — E119 Type 2 diabetes mellitus without complications: Secondary | ICD-10-CM | POA: Insufficient documentation

## 2020-09-28 DIAGNOSIS — Z79899 Other long term (current) drug therapy: Secondary | ICD-10-CM | POA: Insufficient documentation

## 2020-09-28 DIAGNOSIS — R0602 Shortness of breath: Secondary | ICD-10-CM | POA: Insufficient documentation

## 2020-09-28 DIAGNOSIS — I10 Essential (primary) hypertension: Secondary | ICD-10-CM | POA: Diagnosis not present

## 2020-09-28 DIAGNOSIS — S2241XA Multiple fractures of ribs, right side, initial encounter for closed fracture: Secondary | ICD-10-CM | POA: Diagnosis not present

## 2020-09-28 DIAGNOSIS — Z7982 Long term (current) use of aspirin: Secondary | ICD-10-CM | POA: Diagnosis not present

## 2020-09-28 LAB — BASIC METABOLIC PANEL
Anion gap: 9 (ref 5–15)
BUN: 24 mg/dL — ABNORMAL HIGH (ref 8–23)
CO2: 26 mmol/L (ref 22–32)
Calcium: 9.7 mg/dL (ref 8.9–10.3)
Chloride: 102 mmol/L (ref 98–111)
Creatinine, Ser: 1.18 mg/dL (ref 0.61–1.24)
GFR, Estimated: >60 mL/min (ref >60)
Glucose, Bld: 298 mg/dL — ABNORMAL HIGH (ref 70–99)
Potassium: 3.9 mmol/L (ref 3.5–5.1)
Sodium: 137 mmol/L (ref 135–145)

## 2020-09-28 LAB — CBC WITH DIFFERENTIAL/PLATELET
Abs Immature Granulocytes: 0.02 10*3/uL (ref 0.00–0.07)
Basophils Absolute: 0 10*3/uL (ref 0.0–0.1)
Basophils Relative: 1 %
Eosinophils Absolute: 0.4 10*3/uL (ref 0.0–0.5)
Eosinophils Relative: 7 %
HCT: 36.3 % — ABNORMAL LOW (ref 39.0–52.0)
Hemoglobin: 11.9 g/dL — ABNORMAL LOW (ref 13.0–17.0)
Immature Granulocytes: 0 %
Lymphocytes Relative: 28 %
Lymphs Abs: 1.5 10*3/uL (ref 0.7–4.0)
MCH: 30 pg (ref 26.0–34.0)
MCHC: 32.8 g/dL (ref 30.0–36.0)
MCV: 91.4 fL (ref 80.0–100.0)
Monocytes Absolute: 0.5 10*3/uL (ref 0.1–1.0)
Monocytes Relative: 10 %
Neutro Abs: 2.8 10*3/uL (ref 1.7–7.7)
Neutrophils Relative %: 54 %
Platelets: 260 10*3/uL (ref 150–400)
RBC: 3.97 MIL/uL — ABNORMAL LOW (ref 4.22–5.81)
RDW: 13.1 % (ref 11.5–15.5)
WBC: 5.2 10*3/uL (ref 4.0–10.5)
nRBC: 0 % (ref 0.0–0.2)

## 2020-09-28 LAB — TROPONIN I (HIGH SENSITIVITY)
Troponin I (High Sensitivity): 4 ng/L (ref <18)
Troponin I (High Sensitivity): 4 ng/L (ref <18)

## 2020-09-28 MED ORDER — HYDROCODONE-ACETAMINOPHEN 5-325 MG PO TABS: 1.0000 | ORAL_TABLET | Freq: Four times a day (QID) | ORAL | 0 refills | Status: DC | PRN

## 2020-09-28 MED ORDER — IOHEXOL 350 MG/ML SOLN
100.0000 mL | Freq: Once | INTRAVENOUS | Status: AC | PRN
  Administered 2020-09-28: 100 mL via INTRAVENOUS

## 2020-09-28 MED ORDER — HYDROCODONE-ACETAMINOPHEN 5-325 MG PO TABS
1.0000 | ORAL_TABLET | Freq: Once | ORAL | Status: AC
  Administered 2020-09-28: 1 via ORAL
  Filled 2020-09-28: qty 1

## 2020-09-28 MED ORDER — LIDOCAINE 5 % EX PTCH
1.0000 | MEDICATED_PATCH | Freq: Once | CUTANEOUS | Status: DC
  Administered 2020-09-28: 1 via TRANSDERMAL
  Filled 2020-09-28: qty 1

## 2020-09-28 NOTE — ED Triage Notes (Signed)
Pt arrives from home due to R sided chest pain x 1 wk. Pt denies injury. Pt SOB & holding R rib area during triage.
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
## 2020-09-28 NOTE — ED Notes (Signed)
Patient transported to CT 
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
## 2021-06-01 IMAGING — DX DG CHEST 1V PORT
1 series · 1 of 1 positions shown · non-contrast
Comparison: 09/10/2019

CLINICAL DATA: Chest pain

EXAM:
PORTABLE CHEST 1 VIEW

[chest ap]
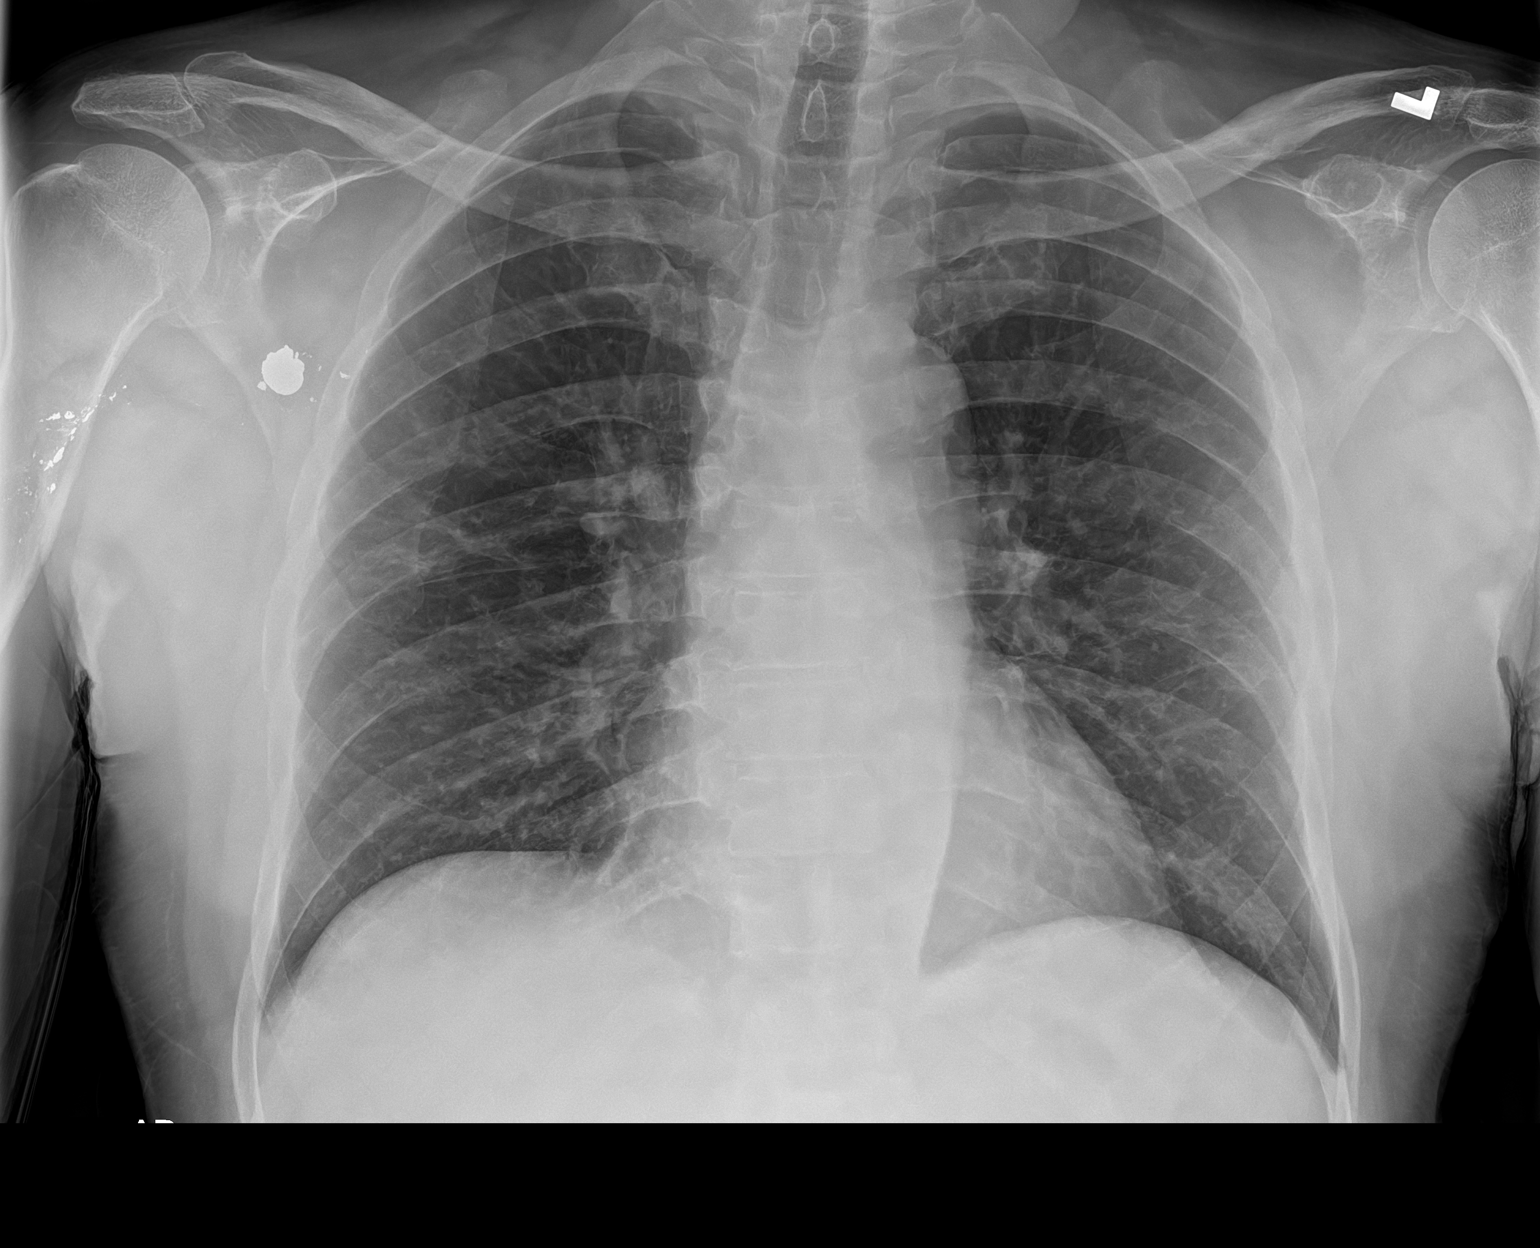

[1 of 1 positions shown; findings below may reference images not displayed]

FINDINGS: Scarring in the right mid lung. Left lung clear. Heart is normal
size. No effusions. No acute bony abnormality.
IMPRESSION: No active cardiopulmonary disease.

## 2021-07-04 DIAGNOSIS — M5430 Sciatica, unspecified side: Secondary | ICD-10-CM | POA: Diagnosis not present

## 2021-07-04 DIAGNOSIS — I10 Essential (primary) hypertension: Secondary | ICD-10-CM | POA: Diagnosis not present

## 2021-07-04 DIAGNOSIS — Z299 Encounter for prophylactic measures, unspecified: Secondary | ICD-10-CM | POA: Diagnosis not present

## 2021-07-04 DIAGNOSIS — R5383 Other fatigue: Secondary | ICD-10-CM | POA: Diagnosis not present

## 2021-07-04 DIAGNOSIS — Z2821 Immunization not carried out because of patient refusal: Secondary | ICD-10-CM | POA: Diagnosis not present

## 2021-07-04 DIAGNOSIS — E1165 Type 2 diabetes mellitus with hyperglycemia: Secondary | ICD-10-CM | POA: Diagnosis not present

## 2021-07-04 DIAGNOSIS — Z6828 Body mass index (BMI) 28.0-28.9, adult: Secondary | ICD-10-CM | POA: Diagnosis not present

## 2021-07-18 DIAGNOSIS — E1165 Type 2 diabetes mellitus with hyperglycemia: Secondary | ICD-10-CM | POA: Diagnosis not present

## 2021-07-24 DIAGNOSIS — E7800 Pure hypercholesterolemia, unspecified: Secondary | ICD-10-CM | POA: Diagnosis not present

## 2021-07-24 DIAGNOSIS — Z794 Long term (current) use of insulin: Secondary | ICD-10-CM | POA: Diagnosis not present

## 2021-07-24 DIAGNOSIS — E119 Type 2 diabetes mellitus without complications: Secondary | ICD-10-CM | POA: Diagnosis not present

## 2021-07-24 DIAGNOSIS — I10 Essential (primary) hypertension: Secondary | ICD-10-CM | POA: Diagnosis not present

## 2021-07-24 DIAGNOSIS — R079 Chest pain, unspecified: Secondary | ICD-10-CM | POA: Diagnosis not present

## 2021-08-07 DIAGNOSIS — R059 Cough, unspecified: Secondary | ICD-10-CM | POA: Diagnosis not present

## 2021-08-07 DIAGNOSIS — J069 Acute upper respiratory infection, unspecified: Secondary | ICD-10-CM | POA: Diagnosis not present

## 2021-08-09 DIAGNOSIS — R350 Frequency of micturition: Secondary | ICD-10-CM | POA: Diagnosis not present

## 2021-08-10 ENCOUNTER — Emergency Department (HOSPITAL_COMMUNITY)
Admission: EM | Admit: 2021-08-10 | Discharge: 2021-08-10 | Disposition: A | Discharge status: Home / Self Care | Payer: Medicare PPO | Attending: Emergency Medicine | Admitting: Emergency Medicine

## 2021-08-10 ENCOUNTER — Encounter (HOSPITAL_COMMUNITY): Payer: Self-pay | Admitting: *Deleted

## 2021-08-10 DIAGNOSIS — E119 Type 2 diabetes mellitus without complications: Secondary | ICD-10-CM | POA: Insufficient documentation

## 2021-08-10 DIAGNOSIS — Z859 Personal history of malignant neoplasm, unspecified: Secondary | ICD-10-CM | POA: Insufficient documentation

## 2021-08-10 DIAGNOSIS — N3000 Acute cystitis without hematuria: Secondary | ICD-10-CM

## 2021-08-10 DIAGNOSIS — Z7982 Long term (current) use of aspirin: Secondary | ICD-10-CM | POA: Insufficient documentation

## 2021-08-10 DIAGNOSIS — R339 Retention of urine, unspecified: Secondary | ICD-10-CM | POA: Diagnosis not present

## 2021-08-10 DIAGNOSIS — Z79899 Other long term (current) drug therapy: Secondary | ICD-10-CM | POA: Diagnosis not present

## 2021-08-10 DIAGNOSIS — I10 Essential (primary) hypertension: Secondary | ICD-10-CM | POA: Insufficient documentation

## 2021-08-10 DIAGNOSIS — Z8616 Personal history of COVID-19: Secondary | ICD-10-CM | POA: Diagnosis not present

## 2021-08-10 DIAGNOSIS — Z7984 Long term (current) use of oral hypoglycemic drugs: Secondary | ICD-10-CM | POA: Insufficient documentation

## 2021-08-10 LAB — URINALYSIS, MICROSCOPIC (REFLEX): WBC, UA: >50 WBC/hpf (ref 0–5)

## 2021-08-10 LAB — URINALYSIS, ROUTINE W REFLEX MICROSCOPIC
Bilirubin Urine: NEGATIVE
Glucose, UA: >=500 mg/dL — AB
Ketones, ur: NEGATIVE mg/dL
Nitrite: POSITIVE — AB
Protein, ur: 100 mg/dL — AB
Specific Gravity, Urine: 1.025 (ref 1.005–1.030)
pH: 6 (ref 5.0–8.0)

## 2021-08-10 MED ORDER — PHENAZOPYRIDINE HCL 100 MG PO TABS
200.0000 mg | ORAL_TABLET | Freq: Once | ORAL | Status: AC
  Administered 2021-08-10: 12:00:00 200 mg via ORAL
  Filled 2021-08-10: qty 2

## 2021-08-10 MED ORDER — HYDROCODONE-ACETAMINOPHEN 5-325 MG PO TABS: 1.0000 | ORAL_TABLET | ORAL | 0 refills | Status: AC | PRN

## 2021-08-10 MED ORDER — CEPHALEXIN 500 MG PO CAPS
500.0000 mg | ORAL_CAPSULE | Freq: Once | ORAL | Status: AC
  Administered 2021-08-10: 13:00:00 500 mg via ORAL
  Filled 2021-08-10: qty 1

## 2021-08-10 MED ORDER — CEPHALEXIN 500 MG PO CAPS: 500.0000 mg | ORAL_CAPSULE | Freq: Four times a day (QID) | ORAL | 0 refills | Status: AC

## 2021-08-10 MED ORDER — MORPHINE SULFATE (PF) 4 MG/ML IV SOLN
4.0000 mg | Freq: Once | INTRAVENOUS | Status: AC
  Administered 2021-08-10: 13:00:00 4 mg via INTRAMUSCULAR
  Filled 2021-08-10: qty 1

## 2021-08-10 MED ORDER — PHENAZOPYRIDINE HCL 200 MG PO TABS: 200.0000 mg | ORAL_TABLET | Freq: Three times a day (TID) | ORAL | 0 refills | Status: AC

## 2021-08-10 NOTE — ED Provider Notes (Addendum)
[facility] EMERGENCY DEPARTMENT Provider Note   CSN: [id number] Arrival date & time: 08/10/21  1059     History Chief Complaint  Patient presents with   Urinary Retention    Randy Lee is a 70 y.o. male.  Pt presents to the ED today with dysuria.  Pt said he has had very painful urine for 2-3 days.  He said not much comes out when he urinates.  He feels like he is not emptying his bladder and needs a foley.  Pt denies f/c.      Past Medical History:  Diagnosis Date   Acute pharyngitis    Blind 2005   Cancer ([place]) 06/30/2019   per wife   Chest pain, unspecified    Diabetes mellitus type 2, uncomplicated ([place])    x 7 years   Dyspnea    Esophageal reflux    Hepatitis hep c   Diagnosed last year   Hyperlipidemia    Psychosexual dysfunction with inhibited sexual excitement    Type II or unspecified type diabetes mellitus without mention of complication, not stated as uncontrolled    Unspecified essential hypertension    Unspecified pruritic disorder     Patient Active Problem List   Diagnosis Date Noted   Type 2 diabetes mellitus ([place])    Essential hypertension    Esophageal reflux    Hyperlipidemia    Glaucoma    Pneumonia due to COVID-19 virus 07/19/2019    Past Surgical History:  Procedure Laterality Date   CERVICAL SPINE SURGERY     CHOLECYSTECTOMY     CORNEAL TRANSPLANT     CORNEAL TRANSPLANT     rejected   EYE SURGERY     cataracts   HIATAL HERNIA REPAIR     LIVER BIOPSY     WOUND DEBRIDEMENT  01/24/2012   Procedure: DEBRIDEMENT ABDOMINAL WOUND;  Surgeon: Jamesetta So, MD;  Location: [facility];  Service: General;  Laterality: N/A;       Family History  Problem Relation Age of Onset   Hypertension Mother     Social History   Tobacco Use   Smoking status: Never   Smokeless tobacco: Never  Substance Use Topics   Alcohol use: No    Comment: Quit 30 yrs ago   Drug use: No    Comment: years ago    Home Medications Prior to  Admission medications   Medication Sig Start Date End Date Taking? Authorizing Provider  cephALEXin (KEFLEX) 500 MG capsule Take 1 capsule (500 mg total) by mouth 4 (four) times daily. 08/10/21  Yes Isla Pence, MD  HYDROcodone-acetaminophen (NORCO/VICODIN) 5-325 MG tablet Take 1 tablet by mouth every 4 (four) hours as needed. 08/10/21  Yes Isla Pence, MD  phenazopyridine (PYRIDIUM) 200 MG tablet Take 1 tablet (200 mg total) by mouth 3 (three) times daily. 08/10/21  Yes Isla Pence, MD  acetaminophen (TYLENOL) 325 MG tablet Take 2 tablets (650 mg total) by mouth every 6 (six) hours as needed for mild pain, fever or headache (or Fever >/= 101). 09/13/19   Roxan Hockey, MD  albuterol (VENTOLIN HFA) 108 (90 Base) MCG/ACT inhaler Inhale 2 puffs into the lungs every 6 (six) hours as needed for wheezing or shortness of breath. 09/13/19   Emokpae, Courage, MD  amLODipine (NORVASC) 5 MG tablet Take 1 tablet (5 mg total) by mouth every morning. 09/13/19   Roxan Hockey, MD  ascorbic acid (VITAMIN C) 500 MG tablet Take 1 tablet (500 mg total) by  mouth daily. 09/14/19   Roxan Hockey, MD  aspirin EC 81 MG tablet Take 1 tablet (81 mg total) by mouth daily with breakfast. 09/13/19   Roxan Hockey, MD  diclofenac Sodium (VOLTAREN) 1 % GEL Apply 4 g topically 4 (four) times daily. 09/21/20   [provider]  diphenhydrAMINE (BENADRYL) 25 MG tablet Take 50 mg by mouth every morning.     [provider]  FARXIGA 10 MG TABS tablet Take 10 mg by mouth daily. 05/30/19   [provider]  finasteride (PROSCAR) 5 MG tablet Take 5 mg by mouth every morning.    [provider]  gabapentin (NEURONTIN) 600 MG tablet Take 600 mg by mouth 3 (three) times daily. 08/30/19   [provider]  lidocaine (LIDODERM) 5 % Place 1 patch onto the skin daily. 09/13/20   [provider]  lisinopril-hydrochlorothiazide (ZESTORETIC) 20-25 MG tablet Take 0.5 tablets by mouth  2 (two) times daily. 1/2 tablet every morning Patient taking differently: Take 1 tablet by mouth 2 (two) times daily. 1/2 tablet every morning 09/13/19   Roxan Hockey, MD  metFORMIN (GLUCOPHAGE) 1000 MG tablet Take 1,000 mg by mouth 2 (two) times daily with a meal.    [provider]  NOVOLOG MIX 70/30 FLEXPEN (70-30) 100 UNIT/ML FlexPen Inject 20-30 Units into the skin See admin instructions. Inject 30 units in the morning and 20 units at bedtime 07/19/19   [provider]  oxybutynin (DITROPAN) 5 MG tablet Take 5 mg by mouth every evening.  06/14/19   [provider]  pravastatin (PRAVACHOL) 10 MG tablet Take 10 mg by mouth at bedtime. 07/14/19   [provider]  prednisoLONE acetate (PRED FORTE) 1 % ophthalmic suspension Place 1 drop into the right eye 2 (two) times daily.     [provider]  predniSONE (DELTASONE) 20 MG tablet Take 1 tablet (20 mg total) by mouth daily with breakfast. 09/13/19   Roxan Hockey, MD  sildenafil (VIAGRA) 100 MG tablet Take 100 mg by mouth daily as needed. For e.d.    [provider]  Tamsulosin HCl (FLOMAX) 0.4 MG CAPS Take 0.4 mg by mouth daily.    [provider]  timolol (TIMOPTIC) 0.5 % ophthalmic solution Place 1 drop into the right eye 2 (two) times daily.     [provider]  traMADol (ULTRAM) 50 MG tablet Take 50 mg by mouth 2 (two) times daily as needed. 09/26/20   [provider]  zinc sulfate 220 (50 Zn) MG capsule Take 1 capsule (220 mg total) by mouth daily. 09/14/19   Roxan Hockey, MD    Allergies    Patient has no known allergies.  Review of Systems   Review of Systems  Genitourinary:  Positive for dysuria.  All other systems reviewed and are negative.  Physical Exam Updated Vital Signs BP 126/75 (BP Location: Right Arm)    Pulse 77    Temp 98.2 F (36.8 C) (Oral)    Resp 15    Ht 5' 9.5" (1.765 m)    Wt 82.6 kg    SpO2 98%    BMI 26.49 kg/m   Physical  Exam Vitals and nursing note reviewed.  Constitutional:      Appearance: Normal appearance.  HENT:     Head: Normocephalic and atraumatic.     Right Ear: External ear normal.     Left Ear: External ear normal.     Nose: Nose normal.  Mouth/Throat:     Mouth: Mucous membranes are moist.     Pharynx: Oropharynx is clear.  Eyes:     Comments: Left eye prosthesis  Cardiovascular:     Rate and Rhythm: Normal rate and regular rhythm.     Pulses: Normal pulses.     Heart sounds: Normal heart sounds.  Pulmonary:     Effort: Pulmonary effort is normal.     Breath sounds: Normal breath sounds.  Abdominal:     General: Abdomen is flat. Bowel sounds are normal.     Palpations: Abdomen is soft.  Musculoskeletal:        General: Normal range of motion.     Cervical back: Normal range of motion and neck supple.  Skin:    General: Skin is warm.     Capillary Refill: Capillary refill takes less than 2 seconds.  Neurological:     General: No focal deficit present.     Mental Status: He is alert and oriented to person, place, and time.  Psychiatric:        Mood and Affect: Mood normal.   ED Results / Procedures / Treatments   Labs (all labs ordered are listed, but only abnormal results are displayed) Labs Reviewed  URINALYSIS, ROUTINE W REFLEX MICROSCOPIC - Abnormal; Notable for the following components:      Result Value   APPearance CLOUDY (*)    Glucose, UA >=500 (*)    Hgb urine dipstick LARGE (*)    Protein, ur 100 (*)    Nitrite POSITIVE (*)    Leukocytes,Ua SMALL (*)    All other components within normal limits  URINALYSIS, MICROSCOPIC (REFLEX) - Abnormal; Notable for the following components:   Bacteria, UA MANY (*)    All other components within normal limits  URINE CULTURE    EKG None  Radiology No results found.  Procedures Procedures   Medications Ordered in ED Medications  morphine 4 MG/ML injection 4 mg (has no administration in time range)  cephALEXin  (KEFLEX) capsule 500 mg (has no administration in time range)  phenazopyridine (PYRIDIUM) tablet 200 mg (200 mg Oral Given 08/10/21 1211)    ED Course  I have reviewed the triage vital signs and the nursing notes.  Pertinent labs & imaging results that were available during my care of the patient were reviewed by me and considered in my medical decision making (see chart for details).    MDM Rules/Calculators/A&P                         Post void residual 336, so foley was placed.  He has had one in the past, so knows what to do.  Pt has a UTI, so is treated with keflex.  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  He is told to f/u with urology.  Final Clinical Impression(s) / ED Diagnoses Final diagnoses:  Urinary retention  Acute cystitis without hematuria    Rx / DC Orders ED Discharge Orders          Ordered    phenazopyridine (PYRIDIUM) 200 MG tablet  3 times daily        08/10/21 1246    HYDROcodone-acetaminophen (NORCO/VICODIN) 5-325 MG tablet  Every 4 hours PRN        08/10/21 1246    cephALEXin (KEFLEX) 500 MG capsule  4 times daily        08/10/21 1246  Isla Pence, MD 08/10/21 1247    Isla Pence, MD 08/10/21 1247

## 2021-08-10 NOTE — Discharge Instructions (Addendum)
The pyridium will turn your urine orange.  This is normal.

## 2021-08-10 NOTE — ED Triage Notes (Signed)
C/o painful urination x 2-3 days, here for possible catheter placement

## 2021-08-12 LAB — URINE CULTURE: Culture: >=100000 — AB

## 2021-08-13 ENCOUNTER — Telehealth: Payer: Self-pay | Admitting: Emergency Medicine

## 2021-08-13 NOTE — Telephone Encounter (Signed)
Post ED Visit - Positive Culture Follow-up: Successful Patient Follow-Up  Culture assessed and recommendations reviewed by:  []  Elenor Quinones, Pharm.D. []  Heide Guile, Pharm.D., BCPS AQ-ID []  Parks Neptune, Pharm.D., BCPS []  Alycia Rossetti, Pharm.D., BCPS []  [place], Pharm.D., BCPS, AAHIVP []  Legrand Como, Pharm.D., BCPS, AAHIVP []  Salome Arnt, PharmD, BCPS []  Johnnette Gourd, PharmD, BCPS []  Hughes Better, PharmD, BCPS []  Leeroy Cha, PharmD Gorden Harms PharmD  Positive urine culture  []  Patient discharged without antimicrobial prescription and treatment is now indicated [x]  Organism is resistant to prescribed ED discharge antimicrobial []  Patient with positive blood cultures  Changes discussed with ED provider: Dr Tyrone Nine New antibiotic prescription stop cephalexin, Start macrobid 100mg  po twice daily x 7 days Called to [place] [phone]  Contacted patient, date 08/13/2021, time [place] 08/13/2021, 12:10 PM

## 2021-08-13 NOTE — Progress Notes (Signed)
ED Antimicrobial Stewardship Positive Culture Follow Up   Randy Lee is an 70 y.o. male who presented to [facility] on 08/10/2021 with a chief complaint of  Chief Complaint  Patient presents with   Urinary Retention    Recent Results (from the past 720 hour(s))  Urine Culture     Status: Abnormal   Collection Time: 08/10/21  1:13 PM   Specimen: Urine, Clean Catch  Result Value Ref Range Status   Specimen Description   Final    URINE, CLEAN CATCH Performed at [facility], [address] [place], [place] [address]    Special Requests   Final    NONE Performed at [facility], [address], [place], [place] [address]    Culture (A)  Final    >=100,000 COLONIES/mL KLEBSIELLA OXYTOCA >=100,000 COLONIES/mL ENTEROCOCCUS FAECALIS    Report Status 08/12/2021 FINAL  Final   Organism ID, Bacteria KLEBSIELLA OXYTOCA (A)  Final   Organism ID, Bacteria ENTEROCOCCUS FAECALIS (A)  Final      Susceptibility   Enterococcus faecalis - MIC*    AMPICILLIN <=2 SENSITIVE Sensitive     NITROFURANTOIN <=16 SENSITIVE Sensitive     VANCOMYCIN 1 SENSITIVE Sensitive     * >=100,000 COLONIES/mL ENTEROCOCCUS FAECALIS   Klebsiella oxytoca - MIC*    AMPICILLIN RESISTANT Resistant     CEFAZOLIN <=4 SENSITIVE Sensitive     CEFEPIME <=0.12 SENSITIVE Sensitive     CEFTRIAXONE <=0.25 SENSITIVE Sensitive     CIPROFLOXACIN <=0.25 SENSITIVE Sensitive     GENTAMICIN <=1 SENSITIVE Sensitive     IMIPENEM <=0.25 SENSITIVE Sensitive     NITROFURANTOIN <=16 SENSITIVE Sensitive     TRIMETH/SULFA <=20 SENSITIVE Sensitive     AMPICILLIN/SULBACTAM 8 SENSITIVE Sensitive     PIP/TAZO <=4 SENSITIVE Sensitive     * >=100,000 COLONIES/mL KLEBSIELLA OXYTOCA   Presented with painful urination for 2-3 days with retention. Had foley placed in ED. UA positive (small leukocytes, positive nitrites, many bacteria, WBC >50).   [x]  Treated with cephalexin, one of two organisms resistant to prescribed antimicrobial []   Patient discharged originally without antimicrobial agent and treatment is now indicated  New antibiotic prescription: Stop cephalexin and will switch to Macrobid 100 mg twice daily for 7 days  ED Provider: Dr Marcille Blanco, PharmD, BCCCP Clinical Pharmacist  Monday - Friday phone -  [phone] Saturday - Sunday phone - [phone]

## 2021-08-14 DIAGNOSIS — N390 Urinary tract infection, site not specified: Secondary | ICD-10-CM | POA: Diagnosis not present

## 2021-08-17 DIAGNOSIS — E1165 Type 2 diabetes mellitus with hyperglycemia: Secondary | ICD-10-CM | POA: Diagnosis not present

## 2021-09-11 DIAGNOSIS — R220 Localized swelling, mass and lump, head: Secondary | ICD-10-CM | POA: Diagnosis not present

## 2021-09-11 DIAGNOSIS — N183 Chronic kidney disease, stage 3 unspecified: Secondary | ICD-10-CM | POA: Diagnosis not present

## 2021-09-11 DIAGNOSIS — Z299 Encounter for prophylactic measures, unspecified: Secondary | ICD-10-CM | POA: Diagnosis not present

## 2021-09-11 DIAGNOSIS — I10 Essential (primary) hypertension: Secondary | ICD-10-CM | POA: Diagnosis not present

## 2021-09-11 DIAGNOSIS — I700 Atherosclerosis of aorta: Secondary | ICD-10-CM | POA: Diagnosis not present

## 2021-09-12 DIAGNOSIS — Z87891 Personal history of nicotine dependence: Secondary | ICD-10-CM | POA: Diagnosis not present

## 2021-09-12 DIAGNOSIS — Z6827 Body mass index (BMI) 27.0-27.9, adult: Secondary | ICD-10-CM | POA: Diagnosis not present

## 2021-09-12 DIAGNOSIS — I10 Essential (primary) hypertension: Secondary | ICD-10-CM | POA: Diagnosis not present

## 2021-09-12 DIAGNOSIS — Z299 Encounter for prophylactic measures, unspecified: Secondary | ICD-10-CM | POA: Diagnosis not present

## 2021-09-12 DIAGNOSIS — R220 Localized swelling, mass and lump, head: Secondary | ICD-10-CM | POA: Diagnosis not present

## 2021-09-12 DIAGNOSIS — N183 Chronic kidney disease, stage 3 unspecified: Secondary | ICD-10-CM | POA: Diagnosis not present

## 2021-09-12 DIAGNOSIS — E1165 Type 2 diabetes mellitus with hyperglycemia: Secondary | ICD-10-CM | POA: Diagnosis not present

## 2021-09-12 DIAGNOSIS — E1122 Type 2 diabetes mellitus with diabetic chronic kidney disease: Secondary | ICD-10-CM | POA: Diagnosis not present

## 2021-09-13 DIAGNOSIS — E1122 Type 2 diabetes mellitus with diabetic chronic kidney disease: Secondary | ICD-10-CM | POA: Diagnosis not present

## 2021-09-13 DIAGNOSIS — I739 Peripheral vascular disease, unspecified: Secondary | ICD-10-CM | POA: Diagnosis not present

## 2021-09-13 DIAGNOSIS — Z299 Encounter for prophylactic measures, unspecified: Secondary | ICD-10-CM | POA: Diagnosis not present

## 2021-09-13 DIAGNOSIS — I10 Essential (primary) hypertension: Secondary | ICD-10-CM | POA: Diagnosis not present

## 2021-09-13 DIAGNOSIS — R220 Localized swelling, mass and lump, head: Secondary | ICD-10-CM | POA: Diagnosis not present

## 2021-09-16 DIAGNOSIS — E1165 Type 2 diabetes mellitus with hyperglycemia: Secondary | ICD-10-CM | POA: Diagnosis not present

## 2021-09-17 DIAGNOSIS — R55 Syncope and collapse: Secondary | ICD-10-CM | POA: Diagnosis not present

## 2021-09-17 DIAGNOSIS — E11649 Type 2 diabetes mellitus with hypoglycemia without coma: Secondary | ICD-10-CM | POA: Diagnosis not present

## 2021-09-17 DIAGNOSIS — E162 Hypoglycemia, unspecified: Secondary | ICD-10-CM | POA: Diagnosis not present

## 2021-09-17 DIAGNOSIS — I10 Essential (primary) hypertension: Secondary | ICD-10-CM | POA: Diagnosis not present

## 2021-09-17 DIAGNOSIS — Y999 Unspecified external cause status: Secondary | ICD-10-CM | POA: Diagnosis not present

## 2021-09-17 DIAGNOSIS — Z794 Long term (current) use of insulin: Secondary | ICD-10-CM | POA: Diagnosis not present

## 2021-09-17 DIAGNOSIS — R404 Transient alteration of awareness: Secondary | ICD-10-CM | POA: Diagnosis not present

## 2021-09-17 DIAGNOSIS — E161 Other hypoglycemia: Secondary | ICD-10-CM | POA: Diagnosis not present

## 2021-09-17 DIAGNOSIS — S335XXA Sprain of ligaments of lumbar spine, initial encounter: Secondary | ICD-10-CM | POA: Diagnosis not present

## 2021-09-17 DIAGNOSIS — R4020 Unspecified coma: Secondary | ICD-10-CM | POA: Diagnosis not present

## 2021-10-08 DIAGNOSIS — E1140 Type 2 diabetes mellitus with diabetic neuropathy, unspecified: Secondary | ICD-10-CM | POA: Diagnosis not present

## 2021-10-08 DIAGNOSIS — Z299 Encounter for prophylactic measures, unspecified: Secondary | ICD-10-CM | POA: Diagnosis not present

## 2021-10-08 DIAGNOSIS — Z87891 Personal history of nicotine dependence: Secondary | ICD-10-CM | POA: Diagnosis not present

## 2021-10-08 DIAGNOSIS — E1122 Type 2 diabetes mellitus with diabetic chronic kidney disease: Secondary | ICD-10-CM | POA: Diagnosis not present

## 2021-10-08 DIAGNOSIS — E1165 Type 2 diabetes mellitus with hyperglycemia: Secondary | ICD-10-CM | POA: Diagnosis not present

## 2021-10-08 DIAGNOSIS — N183 Chronic kidney disease, stage 3 unspecified: Secondary | ICD-10-CM | POA: Diagnosis not present

## 2021-10-08 DIAGNOSIS — Z6827 Body mass index (BMI) 27.0-27.9, adult: Secondary | ICD-10-CM | POA: Diagnosis not present

## 2021-10-16 DIAGNOSIS — E1165 Type 2 diabetes mellitus with hyperglycemia: Secondary | ICD-10-CM | POA: Diagnosis not present

## 2021-10-29 DIAGNOSIS — C61 Malignant neoplasm of prostate: Secondary | ICD-10-CM | POA: Diagnosis not present

## 2021-10-29 DIAGNOSIS — R339 Retention of urine, unspecified: Secondary | ICD-10-CM | POA: Diagnosis not present

## 2021-11-15 DIAGNOSIS — E785 Hyperlipidemia, unspecified: Secondary | ICD-10-CM | POA: Diagnosis not present

## 2021-11-15 DIAGNOSIS — E1165 Type 2 diabetes mellitus with hyperglycemia: Secondary | ICD-10-CM | POA: Diagnosis not present

## 2021-11-15 DIAGNOSIS — I10 Essential (primary) hypertension: Secondary | ICD-10-CM | POA: Diagnosis not present

## 2021-11-26 DIAGNOSIS — J40 Bronchitis, not specified as acute or chronic: Secondary | ICD-10-CM | POA: Diagnosis not present

## 2021-11-26 DIAGNOSIS — Z299 Encounter for prophylactic measures, unspecified: Secondary | ICD-10-CM | POA: Diagnosis not present

## 2021-11-26 DIAGNOSIS — M5431 Sciatica, right side: Secondary | ICD-10-CM | POA: Diagnosis not present

## 2021-11-26 DIAGNOSIS — Z87891 Personal history of nicotine dependence: Secondary | ICD-10-CM | POA: Diagnosis not present

## 2021-11-26 DIAGNOSIS — I10 Essential (primary) hypertension: Secondary | ICD-10-CM | POA: Diagnosis not present

## 2021-12-03 DIAGNOSIS — R339 Retention of urine, unspecified: Secondary | ICD-10-CM | POA: Diagnosis not present

## 2021-12-06 DIAGNOSIS — E7800 Pure hypercholesterolemia, unspecified: Secondary | ICD-10-CM | POA: Diagnosis not present

## 2021-12-06 DIAGNOSIS — M549 Dorsalgia, unspecified: Secondary | ICD-10-CM | POA: Diagnosis not present

## 2021-12-06 DIAGNOSIS — E1140 Type 2 diabetes mellitus with diabetic neuropathy, unspecified: Secondary | ICD-10-CM | POA: Diagnosis not present

## 2021-12-06 DIAGNOSIS — Z299 Encounter for prophylactic measures, unspecified: Secondary | ICD-10-CM | POA: Diagnosis not present

## 2021-12-06 DIAGNOSIS — Z0000 Encounter for general adult medical examination without abnormal findings: Secondary | ICD-10-CM | POA: Diagnosis not present

## 2021-12-06 DIAGNOSIS — Z6827 Body mass index (BMI) 27.0-27.9, adult: Secondary | ICD-10-CM | POA: Diagnosis not present

## 2021-12-06 DIAGNOSIS — R5383 Other fatigue: Secondary | ICD-10-CM | POA: Diagnosis not present

## 2021-12-16 DIAGNOSIS — E1165 Type 2 diabetes mellitus with hyperglycemia: Secondary | ICD-10-CM | POA: Diagnosis not present

## 2021-12-18 DIAGNOSIS — M5416 Radiculopathy, lumbar region: Secondary | ICD-10-CM | POA: Diagnosis not present

## 2021-12-25 DIAGNOSIS — M5416 Radiculopathy, lumbar region: Secondary | ICD-10-CM | POA: Diagnosis not present

## 2021-12-27 DIAGNOSIS — H409 Unspecified glaucoma: Secondary | ICD-10-CM | POA: Diagnosis not present

## 2022-01-09 DIAGNOSIS — N183 Chronic kidney disease, stage 3 unspecified: Secondary | ICD-10-CM | POA: Diagnosis not present

## 2022-01-09 DIAGNOSIS — M5430 Sciatica, unspecified side: Secondary | ICD-10-CM | POA: Diagnosis not present

## 2022-01-09 DIAGNOSIS — I10 Essential (primary) hypertension: Secondary | ICD-10-CM | POA: Diagnosis not present

## 2022-01-09 DIAGNOSIS — Z6827 Body mass index (BMI) 27.0-27.9, adult: Secondary | ICD-10-CM | POA: Diagnosis not present

## 2022-01-09 DIAGNOSIS — E1165 Type 2 diabetes mellitus with hyperglycemia: Secondary | ICD-10-CM | POA: Diagnosis not present

## 2022-01-09 DIAGNOSIS — Z299 Encounter for prophylactic measures, unspecified: Secondary | ICD-10-CM | POA: Diagnosis not present

## 2022-01-14 DIAGNOSIS — H05012 Cellulitis of left orbit: Secondary | ICD-10-CM | POA: Diagnosis not present

## 2022-01-14 DIAGNOSIS — T85398A Other mechanical complication of other ocular prosthetic devices, implants and grafts, initial encounter: Secondary | ICD-10-CM | POA: Diagnosis not present

## 2022-01-14 DIAGNOSIS — Z87891 Personal history of nicotine dependence: Secondary | ICD-10-CM | POA: Diagnosis not present

## 2022-01-14 DIAGNOSIS — H5712 Ocular pain, left eye: Secondary | ICD-10-CM | POA: Diagnosis not present

## 2022-01-14 DIAGNOSIS — H02846 Edema of left eye, unspecified eyelid: Secondary | ICD-10-CM | POA: Diagnosis not present

## 2022-01-14 DIAGNOSIS — Z970 Presence of artificial eye: Secondary | ICD-10-CM | POA: Diagnosis not present

## 2022-01-14 DIAGNOSIS — M7989 Other specified soft tissue disorders: Secondary | ICD-10-CM | POA: Diagnosis not present

## 2022-01-15 DIAGNOSIS — E1165 Type 2 diabetes mellitus with hyperglycemia: Secondary | ICD-10-CM | POA: Diagnosis not present

## 2022-01-16 DIAGNOSIS — Z299 Encounter for prophylactic measures, unspecified: Secondary | ICD-10-CM | POA: Diagnosis not present

## 2022-01-16 DIAGNOSIS — J069 Acute upper respiratory infection, unspecified: Secondary | ICD-10-CM | POA: Diagnosis not present

## 2022-01-16 DIAGNOSIS — Z6826 Body mass index (BMI) 26.0-26.9, adult: Secondary | ICD-10-CM | POA: Diagnosis not present

## 2022-01-16 DIAGNOSIS — I10 Essential (primary) hypertension: Secondary | ICD-10-CM | POA: Diagnosis not present

## 2022-01-16 DIAGNOSIS — Z9001 Acquired absence of eye: Secondary | ICD-10-CM | POA: Diagnosis not present

## 2022-01-16 DIAGNOSIS — H109 Unspecified conjunctivitis: Secondary | ICD-10-CM | POA: Diagnosis not present

## 2022-01-29 DIAGNOSIS — M5416 Radiculopathy, lumbar region: Secondary | ICD-10-CM | POA: Diagnosis not present

## 2022-01-29 DIAGNOSIS — R293 Abnormal posture: Secondary | ICD-10-CM | POA: Diagnosis not present

## 2022-01-30 DIAGNOSIS — E119 Type 2 diabetes mellitus without complications: Secondary | ICD-10-CM | POA: Diagnosis not present

## 2022-01-31 DIAGNOSIS — H5710 Ocular pain, unspecified eye: Secondary | ICD-10-CM | POA: Diagnosis not present

## 2022-01-31 DIAGNOSIS — Z947 Corneal transplant status: Secondary | ICD-10-CM | POA: Diagnosis not present

## 2022-01-31 DIAGNOSIS — Q111 Other anophthalmos: Secondary | ICD-10-CM | POA: Diagnosis not present

## 2022-01-31 DIAGNOSIS — H409 Unspecified glaucoma: Secondary | ICD-10-CM | POA: Diagnosis not present

## 2022-01-31 DIAGNOSIS — H5440 Blindness, one eye, unspecified eye: Secondary | ICD-10-CM | POA: Diagnosis not present

## 2022-02-01 DIAGNOSIS — R293 Abnormal posture: Secondary | ICD-10-CM | POA: Diagnosis not present

## 2022-02-01 DIAGNOSIS — M5416 Radiculopathy, lumbar region: Secondary | ICD-10-CM | POA: Diagnosis not present

## 2022-02-05 DIAGNOSIS — M5416 Radiculopathy, lumbar region: Secondary | ICD-10-CM | POA: Diagnosis not present

## 2022-02-05 DIAGNOSIS — R293 Abnormal posture: Secondary | ICD-10-CM | POA: Diagnosis not present

## 2022-02-07 DIAGNOSIS — M5416 Radiculopathy, lumbar region: Secondary | ICD-10-CM | POA: Diagnosis not present

## 2022-02-07 DIAGNOSIS — R293 Abnormal posture: Secondary | ICD-10-CM | POA: Diagnosis not present

## 2022-02-12 DIAGNOSIS — M5416 Radiculopathy, lumbar region: Secondary | ICD-10-CM | POA: Diagnosis not present

## 2022-02-12 DIAGNOSIS — R293 Abnormal posture: Secondary | ICD-10-CM | POA: Diagnosis not present

## 2022-02-14 DIAGNOSIS — E1165 Type 2 diabetes mellitus with hyperglycemia: Secondary | ICD-10-CM | POA: Diagnosis not present

## 2022-02-14 DIAGNOSIS — R293 Abnormal posture: Secondary | ICD-10-CM | POA: Diagnosis not present

## 2022-02-14 DIAGNOSIS — M5416 Radiculopathy, lumbar region: Secondary | ICD-10-CM | POA: Diagnosis not present

## 2022-02-15 DIAGNOSIS — Z299 Encounter for prophylactic measures, unspecified: Secondary | ICD-10-CM | POA: Diagnosis not present

## 2022-02-15 DIAGNOSIS — E1140 Type 2 diabetes mellitus with diabetic neuropathy, unspecified: Secondary | ICD-10-CM | POA: Diagnosis not present

## 2022-02-15 DIAGNOSIS — J449 Chronic obstructive pulmonary disease, unspecified: Secondary | ICD-10-CM | POA: Diagnosis not present

## 2022-02-15 DIAGNOSIS — I10 Essential (primary) hypertension: Secondary | ICD-10-CM | POA: Diagnosis not present

## 2022-02-15 DIAGNOSIS — Z6827 Body mass index (BMI) 27.0-27.9, adult: Secondary | ICD-10-CM | POA: Diagnosis not present

## 2022-02-15 DIAGNOSIS — E785 Hyperlipidemia, unspecified: Secondary | ICD-10-CM | POA: Diagnosis not present

## 2022-02-15 DIAGNOSIS — E1165 Type 2 diabetes mellitus with hyperglycemia: Secondary | ICD-10-CM | POA: Diagnosis not present

## 2022-03-12 DIAGNOSIS — S0572XD Avulsion of left eye, subsequent encounter: Secondary | ICD-10-CM | POA: Diagnosis not present

## 2022-03-18 DIAGNOSIS — E1165 Type 2 diabetes mellitus with hyperglycemia: Secondary | ICD-10-CM | POA: Diagnosis not present

## 2022-03-28 DIAGNOSIS — E1165 Type 2 diabetes mellitus with hyperglycemia: Secondary | ICD-10-CM | POA: Diagnosis not present

## 2022-03-28 DIAGNOSIS — Z299 Encounter for prophylactic measures, unspecified: Secondary | ICD-10-CM | POA: Diagnosis not present

## 2022-03-28 DIAGNOSIS — Z6827 Body mass index (BMI) 27.0-27.9, adult: Secondary | ICD-10-CM | POA: Diagnosis not present

## 2022-03-28 DIAGNOSIS — Z0000 Encounter for general adult medical examination without abnormal findings: Secondary | ICD-10-CM | POA: Diagnosis not present

## 2022-03-28 DIAGNOSIS — C61 Malignant neoplasm of prostate: Secondary | ICD-10-CM | POA: Diagnosis not present

## 2022-03-28 DIAGNOSIS — Z1339 Encounter for screening examination for other mental health and behavioral disorders: Secondary | ICD-10-CM | POA: Diagnosis not present

## 2022-03-28 DIAGNOSIS — Z1331 Encounter for screening for depression: Secondary | ICD-10-CM | POA: Diagnosis not present

## 2022-03-28 DIAGNOSIS — Z7189 Other specified counseling: Secondary | ICD-10-CM | POA: Diagnosis not present

## 2022-03-28 DIAGNOSIS — Z79899 Other long term (current) drug therapy: Secondary | ICD-10-CM | POA: Diagnosis not present

## 2022-03-28 DIAGNOSIS — I10 Essential (primary) hypertension: Secondary | ICD-10-CM | POA: Diagnosis not present

## 2022-03-28 DIAGNOSIS — E7800 Pure hypercholesterolemia, unspecified: Secondary | ICD-10-CM | POA: Diagnosis not present

## 2022-03-28 DIAGNOSIS — J849 Interstitial pulmonary disease, unspecified: Secondary | ICD-10-CM | POA: Diagnosis not present

## 2022-03-28 DIAGNOSIS — R5383 Other fatigue: Secondary | ICD-10-CM | POA: Diagnosis not present

## 2022-04-17 DIAGNOSIS — H02421 Myogenic ptosis of right eyelid: Secondary | ICD-10-CM | POA: Diagnosis not present

## 2022-04-17 DIAGNOSIS — H0589 Other disorders of orbit: Secondary | ICD-10-CM | POA: Diagnosis not present

## 2022-04-17 DIAGNOSIS — E1165 Type 2 diabetes mellitus with hyperglycemia: Secondary | ICD-10-CM | POA: Diagnosis not present

## 2022-04-26 DIAGNOSIS — C61 Malignant neoplasm of prostate: Secondary | ICD-10-CM | POA: Diagnosis not present

## 2022-05-03 DIAGNOSIS — R339 Retention of urine, unspecified: Secondary | ICD-10-CM | POA: Diagnosis not present

## 2022-05-03 DIAGNOSIS — R3912 Poor urinary stream: Secondary | ICD-10-CM | POA: Diagnosis not present

## 2022-05-03 DIAGNOSIS — C61 Malignant neoplasm of prostate: Secondary | ICD-10-CM | POA: Diagnosis not present

## 2022-05-06 DIAGNOSIS — W108XXA Fall (on) (from) other stairs and steps, initial encounter: Secondary | ICD-10-CM | POA: Diagnosis not present

## 2022-05-06 DIAGNOSIS — M25511 Pain in right shoulder: Secondary | ICD-10-CM | POA: Diagnosis not present

## 2022-05-06 DIAGNOSIS — M25512 Pain in left shoulder: Secondary | ICD-10-CM | POA: Diagnosis not present

## 2022-05-06 DIAGNOSIS — S40022A Contusion of left upper arm, initial encounter: Secondary | ICD-10-CM | POA: Diagnosis not present

## 2022-05-06 DIAGNOSIS — S40021A Contusion of right upper arm, initial encounter: Secondary | ICD-10-CM | POA: Diagnosis not present

## 2022-05-06 DIAGNOSIS — S7001XA Contusion of right hip, initial encounter: Secondary | ICD-10-CM | POA: Diagnosis not present

## 2022-05-14 DIAGNOSIS — H02135 Senile ectropion of left lower eyelid: Secondary | ICD-10-CM | POA: Diagnosis not present

## 2022-05-14 DIAGNOSIS — N400 Enlarged prostate without lower urinary tract symptoms: Secondary | ICD-10-CM | POA: Diagnosis not present

## 2022-05-14 DIAGNOSIS — E119 Type 2 diabetes mellitus without complications: Secondary | ICD-10-CM | POA: Diagnosis not present

## 2022-05-14 DIAGNOSIS — H02831 Dermatochalasis of right upper eyelid: Secondary | ICD-10-CM | POA: Diagnosis not present

## 2022-05-14 DIAGNOSIS — H5989 Other postprocedural complications and disorders of eye and adnexa, not elsewhere classified: Secondary | ICD-10-CM | POA: Diagnosis not present

## 2022-05-14 DIAGNOSIS — H02105 Unspecified ectropion of left lower eyelid: Secondary | ICD-10-CM | POA: Diagnosis not present

## 2022-05-14 DIAGNOSIS — H02423 Myogenic ptosis of bilateral eyelids: Secondary | ICD-10-CM | POA: Diagnosis not present

## 2022-05-14 DIAGNOSIS — I10 Essential (primary) hypertension: Secondary | ICD-10-CM | POA: Diagnosis not present

## 2022-05-14 DIAGNOSIS — E1140 Type 2 diabetes mellitus with diabetic neuropathy, unspecified: Secondary | ICD-10-CM | POA: Diagnosis not present

## 2022-05-14 DIAGNOSIS — H02834 Dermatochalasis of left upper eyelid: Secondary | ICD-10-CM | POA: Diagnosis not present

## 2022-05-14 DIAGNOSIS — K219 Gastro-esophageal reflux disease without esophagitis: Secondary | ICD-10-CM | POA: Diagnosis not present

## 2022-05-17 DIAGNOSIS — E1165 Type 2 diabetes mellitus with hyperglycemia: Secondary | ICD-10-CM | POA: Diagnosis not present

## 2022-05-21 DIAGNOSIS — L0291 Cutaneous abscess, unspecified: Secondary | ICD-10-CM | POA: Diagnosis not present

## 2022-05-21 DIAGNOSIS — Z299 Encounter for prophylactic measures, unspecified: Secondary | ICD-10-CM | POA: Diagnosis not present

## 2022-05-21 DIAGNOSIS — I10 Essential (primary) hypertension: Secondary | ICD-10-CM | POA: Diagnosis not present

## 2022-05-21 DIAGNOSIS — L0390 Cellulitis, unspecified: Secondary | ICD-10-CM | POA: Diagnosis not present

## 2022-06-03 DIAGNOSIS — E1140 Type 2 diabetes mellitus with diabetic neuropathy, unspecified: Secondary | ICD-10-CM | POA: Diagnosis not present

## 2022-06-03 DIAGNOSIS — I10 Essential (primary) hypertension: Secondary | ICD-10-CM | POA: Diagnosis not present

## 2022-06-03 DIAGNOSIS — Z299 Encounter for prophylactic measures, unspecified: Secondary | ICD-10-CM | POA: Diagnosis not present

## 2022-06-03 DIAGNOSIS — L0390 Cellulitis, unspecified: Secondary | ICD-10-CM | POA: Diagnosis not present

## 2022-06-03 DIAGNOSIS — L0291 Cutaneous abscess, unspecified: Secondary | ICD-10-CM | POA: Diagnosis not present

## 2022-06-10 DIAGNOSIS — I739 Peripheral vascular disease, unspecified: Secondary | ICD-10-CM | POA: Diagnosis not present

## 2022-06-11 DIAGNOSIS — L97919 Non-pressure chronic ulcer of unspecified part of right lower leg with unspecified severity: Secondary | ICD-10-CM | POA: Diagnosis not present

## 2022-06-11 DIAGNOSIS — E7800 Pure hypercholesterolemia, unspecified: Secondary | ICD-10-CM | POA: Diagnosis not present

## 2022-06-11 DIAGNOSIS — N400 Enlarged prostate without lower urinary tract symptoms: Secondary | ICD-10-CM | POA: Diagnosis not present

## 2022-06-11 DIAGNOSIS — Z7982 Long term (current) use of aspirin: Secondary | ICD-10-CM | POA: Diagnosis not present

## 2022-06-11 DIAGNOSIS — T148XXA Other injury of unspecified body region, initial encounter: Secondary | ICD-10-CM | POA: Diagnosis not present

## 2022-06-11 DIAGNOSIS — E11622 Type 2 diabetes mellitus with other skin ulcer: Secondary | ICD-10-CM | POA: Diagnosis not present

## 2022-06-11 DIAGNOSIS — Z794 Long term (current) use of insulin: Secondary | ICD-10-CM | POA: Diagnosis not present

## 2022-06-11 DIAGNOSIS — Z792 Long term (current) use of antibiotics: Secondary | ICD-10-CM | POA: Diagnosis not present

## 2022-06-11 DIAGNOSIS — I10 Essential (primary) hypertension: Secondary | ICD-10-CM | POA: Diagnosis not present

## 2022-06-17 DIAGNOSIS — E1165 Type 2 diabetes mellitus with hyperglycemia: Secondary | ICD-10-CM | POA: Diagnosis not present

## 2022-06-25 DIAGNOSIS — E11622 Type 2 diabetes mellitus with other skin ulcer: Secondary | ICD-10-CM | POA: Diagnosis not present

## 2022-06-25 DIAGNOSIS — L97919 Non-pressure chronic ulcer of unspecified part of right lower leg with unspecified severity: Secondary | ICD-10-CM | POA: Diagnosis not present

## 2022-06-25 DIAGNOSIS — T8189XA Other complications of procedures, not elsewhere classified, initial encounter: Secondary | ICD-10-CM | POA: Diagnosis not present

## 2022-07-17 DIAGNOSIS — E1165 Type 2 diabetes mellitus with hyperglycemia: Secondary | ICD-10-CM | POA: Diagnosis not present

## 2022-08-07 DIAGNOSIS — E1122 Type 2 diabetes mellitus with diabetic chronic kidney disease: Secondary | ICD-10-CM | POA: Diagnosis not present

## 2022-08-15 DIAGNOSIS — H401113 Primary open-angle glaucoma, right eye, severe stage: Secondary | ICD-10-CM | POA: Diagnosis not present

## 2022-08-15 DIAGNOSIS — Q111 Other anophthalmos: Secondary | ICD-10-CM | POA: Diagnosis not present

## 2022-08-15 DIAGNOSIS — H548 Legal blindness, as defined in USA: Secondary | ICD-10-CM | POA: Diagnosis not present

## 2022-08-16 DIAGNOSIS — E1165 Type 2 diabetes mellitus with hyperglycemia: Secondary | ICD-10-CM | POA: Diagnosis not present

## 2022-09-16 DIAGNOSIS — Z947 Corneal transplant status: Secondary | ICD-10-CM | POA: Diagnosis not present

## 2022-09-16 DIAGNOSIS — H52211 Irregular astigmatism, right eye: Secondary | ICD-10-CM | POA: Diagnosis not present

## 2022-09-16 DIAGNOSIS — E1165 Type 2 diabetes mellitus with hyperglycemia: Secondary | ICD-10-CM | POA: Diagnosis not present

## 2022-09-24 DIAGNOSIS — R059 Cough, unspecified: Secondary | ICD-10-CM | POA: Diagnosis not present

## 2022-09-24 DIAGNOSIS — E1140 Type 2 diabetes mellitus with diabetic neuropathy, unspecified: Secondary | ICD-10-CM | POA: Diagnosis not present

## 2022-09-24 DIAGNOSIS — I10 Essential (primary) hypertension: Secondary | ICD-10-CM | POA: Diagnosis not present

## 2022-09-24 DIAGNOSIS — E785 Hyperlipidemia, unspecified: Secondary | ICD-10-CM | POA: Diagnosis not present

## 2022-09-24 DIAGNOSIS — J029 Acute pharyngitis, unspecified: Secondary | ICD-10-CM | POA: Diagnosis not present

## 2022-09-24 DIAGNOSIS — J069 Acute upper respiratory infection, unspecified: Secondary | ICD-10-CM | POA: Diagnosis not present

## 2022-09-24 DIAGNOSIS — Z794 Long term (current) use of insulin: Secondary | ICD-10-CM | POA: Diagnosis not present

## 2022-10-02 DIAGNOSIS — S0572XD Avulsion of left eye, subsequent encounter: Secondary | ICD-10-CM | POA: Diagnosis not present

## 2022-10-09 DIAGNOSIS — Q111 Other anophthalmos: Secondary | ICD-10-CM | POA: Diagnosis not present

## 2022-10-14 DIAGNOSIS — Z947 Corneal transplant status: Secondary | ICD-10-CM | POA: Diagnosis not present

## 2022-10-14 DIAGNOSIS — H401113 Primary open-angle glaucoma, right eye, severe stage: Secondary | ICD-10-CM | POA: Diagnosis not present

## 2022-10-16 DIAGNOSIS — E1140 Type 2 diabetes mellitus with diabetic neuropathy, unspecified: Secondary | ICD-10-CM | POA: Diagnosis not present

## 2022-10-16 DIAGNOSIS — Z299 Encounter for prophylactic measures, unspecified: Secondary | ICD-10-CM | POA: Diagnosis not present

## 2022-10-16 DIAGNOSIS — Z6828 Body mass index (BMI) 28.0-28.9, adult: Secondary | ICD-10-CM | POA: Diagnosis not present

## 2022-10-16 DIAGNOSIS — I10 Essential (primary) hypertension: Secondary | ICD-10-CM | POA: Diagnosis not present

## 2022-10-16 DIAGNOSIS — E1165 Type 2 diabetes mellitus with hyperglycemia: Secondary | ICD-10-CM | POA: Diagnosis not present

## 2022-10-16 DIAGNOSIS — E1122 Type 2 diabetes mellitus with diabetic chronic kidney disease: Secondary | ICD-10-CM | POA: Diagnosis not present

## 2022-10-16 DIAGNOSIS — Z87891 Personal history of nicotine dependence: Secondary | ICD-10-CM | POA: Diagnosis not present

## 2022-11-16 DIAGNOSIS — E1165 Type 2 diabetes mellitus with hyperglycemia: Secondary | ICD-10-CM | POA: Diagnosis not present

## 2022-12-09 DIAGNOSIS — I10 Essential (primary) hypertension: Secondary | ICD-10-CM | POA: Diagnosis not present

## 2022-12-09 DIAGNOSIS — Z0000 Encounter for general adult medical examination without abnormal findings: Secondary | ICD-10-CM | POA: Diagnosis not present

## 2022-12-09 DIAGNOSIS — Z299 Encounter for prophylactic measures, unspecified: Secondary | ICD-10-CM | POA: Diagnosis not present

## 2022-12-09 DIAGNOSIS — E7800 Pure hypercholesterolemia, unspecified: Secondary | ICD-10-CM | POA: Diagnosis not present

## 2022-12-09 DIAGNOSIS — Z79899 Other long term (current) drug therapy: Secondary | ICD-10-CM | POA: Diagnosis not present

## 2022-12-09 DIAGNOSIS — R5383 Other fatigue: Secondary | ICD-10-CM | POA: Diagnosis not present

## 2022-12-09 DIAGNOSIS — I739 Peripheral vascular disease, unspecified: Secondary | ICD-10-CM | POA: Diagnosis not present

## 2022-12-09 DIAGNOSIS — Z125 Encounter for screening for malignant neoplasm of prostate: Secondary | ICD-10-CM | POA: Diagnosis not present

## 2022-12-17 DIAGNOSIS — E1165 Type 2 diabetes mellitus with hyperglycemia: Secondary | ICD-10-CM | POA: Diagnosis not present

## 2022-12-20 DIAGNOSIS — H401113 Primary open-angle glaucoma, right eye, severe stage: Secondary | ICD-10-CM | POA: Diagnosis not present

## 2022-12-20 DIAGNOSIS — S0572XD Avulsion of left eye, subsequent encounter: Secondary | ICD-10-CM | POA: Diagnosis not present

## 2022-12-20 DIAGNOSIS — Q111 Other anophthalmos: Secondary | ICD-10-CM | POA: Diagnosis not present

## 2023-01-01 DIAGNOSIS — E119 Type 2 diabetes mellitus without complications: Secondary | ICD-10-CM | POA: Diagnosis not present

## 2023-01-08 DIAGNOSIS — N3001 Acute cystitis with hematuria: Secondary | ICD-10-CM | POA: Diagnosis not present

## 2023-01-17 DIAGNOSIS — E1165 Type 2 diabetes mellitus with hyperglycemia: Secondary | ICD-10-CM | POA: Diagnosis not present

## 2023-01-21 DIAGNOSIS — R339 Retention of urine, unspecified: Secondary | ICD-10-CM | POA: Diagnosis not present

## 2023-01-21 DIAGNOSIS — E1142 Type 2 diabetes mellitus with diabetic polyneuropathy: Secondary | ICD-10-CM | POA: Diagnosis not present

## 2023-01-21 DIAGNOSIS — E1165 Type 2 diabetes mellitus with hyperglycemia: Secondary | ICD-10-CM | POA: Diagnosis not present

## 2023-01-21 DIAGNOSIS — I10 Essential (primary) hypertension: Secondary | ICD-10-CM | POA: Diagnosis not present

## 2023-01-21 DIAGNOSIS — Z299 Encounter for prophylactic measures, unspecified: Secondary | ICD-10-CM | POA: Diagnosis not present

## 2023-01-21 DIAGNOSIS — J849 Interstitial pulmonary disease, unspecified: Secondary | ICD-10-CM | POA: Diagnosis not present

## 2023-02-16 DIAGNOSIS — E1165 Type 2 diabetes mellitus with hyperglycemia: Secondary | ICD-10-CM | POA: Diagnosis not present

## 2023-02-21 DIAGNOSIS — I10 Essential (primary) hypertension: Secondary | ICD-10-CM | POA: Diagnosis not present

## 2023-02-21 DIAGNOSIS — Z299 Encounter for prophylactic measures, unspecified: Secondary | ICD-10-CM | POA: Diagnosis not present

## 2023-02-21 DIAGNOSIS — E1165 Type 2 diabetes mellitus with hyperglycemia: Secondary | ICD-10-CM | POA: Diagnosis not present

## 2023-03-19 DIAGNOSIS — E1165 Type 2 diabetes mellitus with hyperglycemia: Secondary | ICD-10-CM | POA: Diagnosis not present

## 2023-03-24 DIAGNOSIS — I10 Essential (primary) hypertension: Secondary | ICD-10-CM | POA: Diagnosis not present

## 2023-03-24 DIAGNOSIS — E1165 Type 2 diabetes mellitus with hyperglycemia: Secondary | ICD-10-CM | POA: Diagnosis not present

## 2023-03-24 DIAGNOSIS — Z299 Encounter for prophylactic measures, unspecified: Secondary | ICD-10-CM | POA: Diagnosis not present

## 2023-04-14 DIAGNOSIS — E11622 Type 2 diabetes mellitus with other skin ulcer: Secondary | ICD-10-CM | POA: Diagnosis not present

## 2023-04-14 DIAGNOSIS — Z947 Corneal transplant status: Secondary | ICD-10-CM | POA: Diagnosis not present

## 2023-04-14 DIAGNOSIS — H401113 Primary open-angle glaucoma, right eye, severe stage: Secondary | ICD-10-CM | POA: Diagnosis not present

## 2023-04-14 DIAGNOSIS — L97919 Non-pressure chronic ulcer of unspecified part of right lower leg with unspecified severity: Secondary | ICD-10-CM | POA: Diagnosis not present

## 2023-04-19 DIAGNOSIS — E1165 Type 2 diabetes mellitus with hyperglycemia: Secondary | ICD-10-CM | POA: Diagnosis not present

## 2023-05-02 DIAGNOSIS — C61 Malignant neoplasm of prostate: Secondary | ICD-10-CM | POA: Diagnosis not present

## 2023-05-05 DIAGNOSIS — H401113 Primary open-angle glaucoma, right eye, severe stage: Secondary | ICD-10-CM | POA: Diagnosis not present

## 2023-05-05 DIAGNOSIS — Q111 Other anophthalmos: Secondary | ICD-10-CM | POA: Diagnosis not present

## 2023-05-09 DIAGNOSIS — R338 Other retention of urine: Secondary | ICD-10-CM | POA: Diagnosis not present

## 2023-05-09 DIAGNOSIS — C61 Malignant neoplasm of prostate: Secondary | ICD-10-CM | POA: Diagnosis not present

## 2023-05-09 DIAGNOSIS — R350 Frequency of micturition: Secondary | ICD-10-CM | POA: Diagnosis not present

## 2023-05-09 DIAGNOSIS — R339 Retention of urine, unspecified: Secondary | ICD-10-CM | POA: Diagnosis not present

## 2023-05-19 DIAGNOSIS — E1165 Type 2 diabetes mellitus with hyperglycemia: Secondary | ICD-10-CM | POA: Diagnosis not present

## 2023-05-27 DIAGNOSIS — J069 Acute upper respiratory infection, unspecified: Secondary | ICD-10-CM | POA: Diagnosis not present

## 2023-05-27 DIAGNOSIS — Z20828 Contact with and (suspected) exposure to other viral communicable diseases: Secondary | ICD-10-CM | POA: Diagnosis not present

## 2023-05-28 DIAGNOSIS — M5431 Sciatica, right side: Secondary | ICD-10-CM | POA: Diagnosis not present

## 2023-05-28 DIAGNOSIS — Z299 Encounter for prophylactic measures, unspecified: Secondary | ICD-10-CM | POA: Diagnosis not present

## 2023-05-28 DIAGNOSIS — E1165 Type 2 diabetes mellitus with hyperglycemia: Secondary | ICD-10-CM | POA: Diagnosis not present

## 2023-05-28 DIAGNOSIS — M549 Dorsalgia, unspecified: Secondary | ICD-10-CM | POA: Diagnosis not present

## 2023-05-28 DIAGNOSIS — I10 Essential (primary) hypertension: Secondary | ICD-10-CM | POA: Diagnosis not present

## 2023-06-10 DIAGNOSIS — C61 Malignant neoplasm of prostate: Secondary | ICD-10-CM | POA: Diagnosis not present

## 2023-06-10 DIAGNOSIS — R339 Retention of urine, unspecified: Secondary | ICD-10-CM | POA: Diagnosis not present

## 2023-06-16 DIAGNOSIS — R338 Other retention of urine: Secondary | ICD-10-CM | POA: Diagnosis not present

## 2023-06-16 DIAGNOSIS — C61 Malignant neoplasm of prostate: Secondary | ICD-10-CM | POA: Diagnosis not present

## 2023-06-16 DIAGNOSIS — N401 Enlarged prostate with lower urinary tract symptoms: Secondary | ICD-10-CM | POA: Diagnosis not present

## 2023-06-18 DIAGNOSIS — E1165 Type 2 diabetes mellitus with hyperglycemia: Secondary | ICD-10-CM | POA: Diagnosis not present

## 2023-06-19 DIAGNOSIS — R32 Unspecified urinary incontinence: Secondary | ICD-10-CM | POA: Diagnosis not present

## 2023-06-19 DIAGNOSIS — I10 Essential (primary) hypertension: Secondary | ICD-10-CM | POA: Diagnosis not present

## 2023-06-19 DIAGNOSIS — Z23 Encounter for immunization: Secondary | ICD-10-CM | POA: Diagnosis not present

## 2023-06-19 DIAGNOSIS — Z299 Encounter for prophylactic measures, unspecified: Secondary | ICD-10-CM | POA: Diagnosis not present

## 2023-06-19 DIAGNOSIS — E1165 Type 2 diabetes mellitus with hyperglycemia: Secondary | ICD-10-CM | POA: Diagnosis not present

## 2023-06-19 DIAGNOSIS — N183 Chronic kidney disease, stage 3 unspecified: Secondary | ICD-10-CM | POA: Diagnosis not present

## 2023-06-19 DIAGNOSIS — E1122 Type 2 diabetes mellitus with diabetic chronic kidney disease: Secondary | ICD-10-CM | POA: Diagnosis not present

## 2023-06-20 DIAGNOSIS — R339 Retention of urine, unspecified: Secondary | ICD-10-CM | POA: Diagnosis not present

## 2023-06-20 DIAGNOSIS — E1140 Type 2 diabetes mellitus with diabetic neuropathy, unspecified: Secondary | ICD-10-CM | POA: Diagnosis not present

## 2023-06-20 DIAGNOSIS — J849 Interstitial pulmonary disease, unspecified: Secondary | ICD-10-CM | POA: Diagnosis not present

## 2023-06-20 DIAGNOSIS — I200 Unstable angina: Secondary | ICD-10-CM | POA: Diagnosis not present

## 2023-06-20 DIAGNOSIS — E1122 Type 2 diabetes mellitus with diabetic chronic kidney disease: Secondary | ICD-10-CM | POA: Diagnosis not present

## 2023-06-27 DIAGNOSIS — I10 Essential (primary) hypertension: Secondary | ICD-10-CM | POA: Diagnosis not present

## 2023-06-27 DIAGNOSIS — R52 Pain, unspecified: Secondary | ICD-10-CM | POA: Diagnosis not present

## 2023-06-27 DIAGNOSIS — Z7189 Other specified counseling: Secondary | ICD-10-CM | POA: Diagnosis not present

## 2023-06-27 DIAGNOSIS — Z1339 Encounter for screening examination for other mental health and behavioral disorders: Secondary | ICD-10-CM | POA: Diagnosis not present

## 2023-06-27 DIAGNOSIS — Z0000 Encounter for general adult medical examination without abnormal findings: Secondary | ICD-10-CM | POA: Diagnosis not present

## 2023-06-27 DIAGNOSIS — E1140 Type 2 diabetes mellitus with diabetic neuropathy, unspecified: Secondary | ICD-10-CM | POA: Diagnosis not present

## 2023-06-27 DIAGNOSIS — I739 Peripheral vascular disease, unspecified: Secondary | ICD-10-CM | POA: Diagnosis not present

## 2023-06-27 DIAGNOSIS — Z299 Encounter for prophylactic measures, unspecified: Secondary | ICD-10-CM | POA: Diagnosis not present

## 2023-06-27 DIAGNOSIS — Z1331 Encounter for screening for depression: Secondary | ICD-10-CM | POA: Diagnosis not present

## 2023-07-18 DIAGNOSIS — E1165 Type 2 diabetes mellitus with hyperglycemia: Secondary | ICD-10-CM | POA: Diagnosis not present

## 2023-07-26 DIAGNOSIS — J069 Acute upper respiratory infection, unspecified: Secondary | ICD-10-CM | POA: Diagnosis not present

## 2023-07-26 DIAGNOSIS — Z794 Long term (current) use of insulin: Secondary | ICD-10-CM | POA: Diagnosis not present

## 2023-07-26 DIAGNOSIS — S199XXA Unspecified injury of neck, initial encounter: Secondary | ICD-10-CM | POA: Diagnosis not present

## 2023-07-26 DIAGNOSIS — R059 Cough, unspecified: Secondary | ICD-10-CM | POA: Diagnosis not present

## 2023-07-26 DIAGNOSIS — E119 Type 2 diabetes mellitus without complications: Secondary | ICD-10-CM | POA: Diagnosis not present

## 2023-07-26 DIAGNOSIS — J029 Acute pharyngitis, unspecified: Secondary | ICD-10-CM | POA: Diagnosis not present

## 2023-07-26 DIAGNOSIS — Z1152 Encounter for screening for COVID-19: Secondary | ICD-10-CM | POA: Diagnosis not present

## 2023-07-27 DIAGNOSIS — J029 Acute pharyngitis, unspecified: Secondary | ICD-10-CM | POA: Diagnosis not present

## 2023-07-27 DIAGNOSIS — R059 Cough, unspecified: Secondary | ICD-10-CM | POA: Diagnosis not present

## 2023-07-27 DIAGNOSIS — S199XXA Unspecified injury of neck, initial encounter: Secondary | ICD-10-CM | POA: Diagnosis not present

## 2023-07-30 DIAGNOSIS — Z299 Encounter for prophylactic measures, unspecified: Secondary | ICD-10-CM | POA: Diagnosis not present

## 2023-07-30 DIAGNOSIS — M7552 Bursitis of left shoulder: Secondary | ICD-10-CM | POA: Diagnosis not present

## 2023-07-30 DIAGNOSIS — E1140 Type 2 diabetes mellitus with diabetic neuropathy, unspecified: Secondary | ICD-10-CM | POA: Diagnosis not present

## 2023-07-30 DIAGNOSIS — E1169 Type 2 diabetes mellitus with other specified complication: Secondary | ICD-10-CM | POA: Diagnosis not present

## 2023-07-30 DIAGNOSIS — N183 Chronic kidney disease, stage 3 unspecified: Secondary | ICD-10-CM | POA: Diagnosis not present

## 2023-07-30 DIAGNOSIS — R52 Pain, unspecified: Secondary | ICD-10-CM | POA: Diagnosis not present

## 2023-07-30 DIAGNOSIS — I10 Essential (primary) hypertension: Secondary | ICD-10-CM | POA: Diagnosis not present

## 2023-08-07 DIAGNOSIS — R339 Retention of urine, unspecified: Secondary | ICD-10-CM | POA: Diagnosis not present

## 2023-08-07 DIAGNOSIS — R3912 Poor urinary stream: Secondary | ICD-10-CM | POA: Diagnosis not present

## 2023-08-07 DIAGNOSIS — R3914 Feeling of incomplete bladder emptying: Secondary | ICD-10-CM | POA: Diagnosis not present

## 2023-08-08 DIAGNOSIS — R339 Retention of urine, unspecified: Secondary | ICD-10-CM | POA: Diagnosis not present

## 2023-08-08 DIAGNOSIS — C61 Malignant neoplasm of prostate: Secondary | ICD-10-CM | POA: Diagnosis not present

## 2023-08-18 DIAGNOSIS — E1165 Type 2 diabetes mellitus with hyperglycemia: Secondary | ICD-10-CM | POA: Diagnosis not present
